# Patient Record
Sex: Female | Born: 1935
Health system: Southern US, Community
[De-identification: ages and names within clinical notes are randomized; demographics above are authoritative.]

## PROBLEM LIST (undated history)

## (undated) DIAGNOSIS — F0280 Dementia in other diseases classified elsewhere without behavioral disturbance: Secondary | ICD-10-CM

## (undated) DIAGNOSIS — E7800 Pure hypercholesterolemia, unspecified: Secondary | ICD-10-CM

## (undated) DIAGNOSIS — I10 Essential (primary) hypertension: Secondary | ICD-10-CM

## (undated) DIAGNOSIS — M1990 Unspecified osteoarthritis, unspecified site: Secondary | ICD-10-CM

## (undated) DIAGNOSIS — K5790 Diverticulosis of intestine, part unspecified, without perforation or abscess without bleeding: Secondary | ICD-10-CM

## (undated) HISTORY — DX: Essential (primary) hypertension: I10

## (undated) HISTORY — PX: ROTATOR CUFF REPAIR: SHX139

## (undated) HISTORY — DX: Diverticulosis of intestine, part unspecified, without perforation or abscess without bleeding: K57.90

## (undated) HISTORY — PX: OTHER SURGICAL HISTORY: SHX169

## (undated) HISTORY — DX: Pure hypercholesterolemia, unspecified: E78.00

## (undated) HISTORY — DX: Unspecified osteoarthritis, unspecified site: M19.90

## (undated) HISTORY — PX: BREAST BIOPSY: SHX20

---

## 1998-01-10 ENCOUNTER — Ambulatory Visit (HOSPITAL_COMMUNITY): Admission: RE | Admit: 1998-01-10 | Discharge: 1998-01-10 | Payer: Self-pay | Admitting: Obstetrics and Gynecology

## 1998-12-05 ENCOUNTER — Other Ambulatory Visit: Admission: RE | Admit: 1998-12-05 | Discharge: 1998-12-05 | Payer: Self-pay | Admitting: Obstetrics and Gynecology

## 1998-12-22 ENCOUNTER — Ambulatory Visit (HOSPITAL_COMMUNITY): Admission: RE | Admit: 1998-12-22 | Discharge: 1998-12-22 | Payer: Self-pay | Admitting: Obstetrics and Gynecology

## 1998-12-22 ENCOUNTER — Encounter: Payer: Self-pay | Admitting: Obstetrics and Gynecology

## 1999-10-19 ENCOUNTER — Ambulatory Visit (HOSPITAL_COMMUNITY): Admission: RE | Admit: 1999-10-19 | Discharge: 1999-10-19 | Payer: Self-pay | Admitting: Gastroenterology

## 1999-10-19 ENCOUNTER — Encounter (INDEPENDENT_AMBULATORY_CARE_PROVIDER_SITE_OTHER): Payer: Self-pay | Admitting: Specialist

## 1999-12-26 ENCOUNTER — Other Ambulatory Visit: Admission: RE | Admit: 1999-12-26 | Discharge: 1999-12-26 | Payer: Self-pay | Admitting: Obstetrics and Gynecology

## 1999-12-27 ENCOUNTER — Ambulatory Visit (HOSPITAL_COMMUNITY): Admission: RE | Admit: 1999-12-27 | Discharge: 1999-12-27 | Payer: Self-pay | Admitting: Obstetrics and Gynecology

## 1999-12-27 ENCOUNTER — Encounter: Payer: Self-pay | Admitting: Obstetrics and Gynecology

## 2000-12-26 ENCOUNTER — Other Ambulatory Visit: Admission: RE | Admit: 2000-12-26 | Discharge: 2000-12-26 | Payer: Self-pay | Admitting: Obstetrics and Gynecology

## 2000-12-26 ENCOUNTER — Encounter: Admission: RE | Admit: 2000-12-26 | Discharge: 2000-12-26 | Payer: Self-pay | Admitting: Obstetrics and Gynecology

## 2000-12-26 ENCOUNTER — Encounter: Payer: Self-pay | Admitting: Obstetrics and Gynecology

## 2001-01-03 ENCOUNTER — Encounter: Payer: Self-pay | Admitting: Obstetrics and Gynecology

## 2001-01-03 ENCOUNTER — Ambulatory Visit (HOSPITAL_COMMUNITY): Admission: RE | Admit: 2001-01-03 | Discharge: 2001-01-03 | Payer: Self-pay | Admitting: Obstetrics and Gynecology

## 2002-01-05 ENCOUNTER — Encounter: Payer: Self-pay | Admitting: Obstetrics and Gynecology

## 2002-01-05 ENCOUNTER — Ambulatory Visit (HOSPITAL_COMMUNITY): Admission: RE | Admit: 2002-01-05 | Discharge: 2002-01-05 | Payer: Self-pay | Admitting: Obstetrics and Gynecology

## 2002-08-25 ENCOUNTER — Encounter: Payer: Self-pay | Admitting: Obstetrics and Gynecology

## 2002-08-25 ENCOUNTER — Encounter: Admission: RE | Admit: 2002-08-25 | Discharge: 2002-08-25 | Payer: Self-pay | Admitting: Obstetrics and Gynecology

## 2002-09-25 ENCOUNTER — Encounter: Payer: Self-pay | Admitting: General Surgery

## 2002-09-25 ENCOUNTER — Encounter: Admission: RE | Admit: 2002-09-25 | Discharge: 2002-09-25 | Payer: Self-pay | Admitting: General Surgery

## 2003-01-06 ENCOUNTER — Ambulatory Visit (HOSPITAL_COMMUNITY): Admission: RE | Admit: 2003-01-06 | Discharge: 2003-01-06 | Payer: Self-pay | Admitting: Obstetrics and Gynecology

## 2003-01-06 ENCOUNTER — Encounter: Payer: Self-pay | Admitting: Obstetrics and Gynecology

## 2003-01-11 ENCOUNTER — Other Ambulatory Visit: Admission: RE | Admit: 2003-01-11 | Discharge: 2003-01-11 | Payer: Self-pay | Admitting: Obstetrics and Gynecology

## 2003-01-22 ENCOUNTER — Encounter: Payer: Self-pay | Admitting: Obstetrics and Gynecology

## 2003-01-22 ENCOUNTER — Encounter: Admission: RE | Admit: 2003-01-22 | Discharge: 2003-01-22 | Payer: Self-pay | Admitting: Obstetrics and Gynecology

## 2003-07-22 ENCOUNTER — Ambulatory Visit (HOSPITAL_COMMUNITY): Admission: RE | Admit: 2003-07-22 | Discharge: 2003-07-22 | Payer: Self-pay | Admitting: Gastroenterology

## 2003-08-26 ENCOUNTER — Inpatient Hospital Stay (HOSPITAL_COMMUNITY): Admission: EM | Admit: 2003-08-26 | Discharge: 2003-08-27 | Payer: Self-pay | Admitting: Emergency Medicine

## 2003-08-26 ENCOUNTER — Encounter: Payer: Self-pay | Admitting: Emergency Medicine

## 2003-08-27 ENCOUNTER — Encounter: Payer: Self-pay | Admitting: Internal Medicine

## 2004-03-14 ENCOUNTER — Ambulatory Visit (HOSPITAL_COMMUNITY): Admission: RE | Admit: 2004-03-14 | Discharge: 2004-03-14 | Payer: Self-pay | Admitting: Obstetrics and Gynecology

## 2005-01-01 ENCOUNTER — Encounter: Admission: RE | Admit: 2005-01-01 | Discharge: 2005-01-01 | Payer: Self-pay | Admitting: Obstetrics and Gynecology

## 2005-01-29 ENCOUNTER — Other Ambulatory Visit: Admission: RE | Admit: 2005-01-29 | Discharge: 2005-01-29 | Payer: Self-pay | Admitting: Addiction Medicine

## 2005-03-16 ENCOUNTER — Encounter: Admission: RE | Admit: 2005-03-16 | Discharge: 2005-03-16 | Payer: Self-pay | Admitting: Obstetrics and Gynecology

## 2005-12-21 ENCOUNTER — Ambulatory Visit (HOSPITAL_BASED_OUTPATIENT_CLINIC_OR_DEPARTMENT_OTHER): Admission: RE | Admit: 2005-12-21 | Discharge: 2005-12-21 | Payer: Self-pay | Admitting: Orthopedic Surgery

## 2006-04-02 ENCOUNTER — Encounter: Admission: RE | Admit: 2006-04-02 | Discharge: 2006-04-02 | Payer: Self-pay | Admitting: Obstetrics and Gynecology

## 2006-11-29 ENCOUNTER — Ambulatory Visit: Payer: Self-pay | Admitting: Gastroenterology

## 2006-11-29 LAB — CONVERTED CEMR LAB
Creatinine, Ser: 0.9 mg/dL (ref 0.4–1.2)
Tissue Transglutaminase Ab, IgA: <3 units (ref <5)

## 2006-12-02 ENCOUNTER — Ambulatory Visit: Payer: Self-pay | Admitting: Cardiology

## 2006-12-09 ENCOUNTER — Ambulatory Visit: Payer: Self-pay | Admitting: Gastroenterology

## 2006-12-09 LAB — CONVERTED CEMR LAB
OCCULT 1: NEGATIVE
OCCULT 3: NEGATIVE
OCCULT 5: NEGATIVE

## 2007-02-05 ENCOUNTER — Other Ambulatory Visit: Admission: RE | Admit: 2007-02-05 | Discharge: 2007-02-05 | Payer: Self-pay | Admitting: Obstetrics and Gynecology

## 2007-04-04 ENCOUNTER — Encounter: Admission: RE | Admit: 2007-04-04 | Discharge: 2007-04-04 | Payer: Self-pay | Admitting: Obstetrics and Gynecology

## 2007-06-09 ENCOUNTER — Emergency Department (HOSPITAL_COMMUNITY): Admission: EM | Admit: 2007-06-09 | Discharge: 2007-06-09 | Payer: Self-pay | Admitting: Emergency Medicine

## 2007-12-29 ENCOUNTER — Encounter: Admission: RE | Admit: 2007-12-29 | Discharge: 2007-12-29 | Payer: Self-pay | Admitting: Gynecology

## 2008-04-07 ENCOUNTER — Encounter: Admission: RE | Admit: 2008-04-07 | Discharge: 2008-04-07 | Payer: Self-pay | Admitting: Obstetrics and Gynecology

## 2008-06-17 ENCOUNTER — Telehealth: Payer: Self-pay | Admitting: Internal Medicine

## 2008-07-22 ENCOUNTER — Ambulatory Visit: Payer: Self-pay | Admitting: Internal Medicine

## 2008-07-29 ENCOUNTER — Ambulatory Visit: Payer: Self-pay | Admitting: Internal Medicine

## 2009-02-22 ENCOUNTER — Other Ambulatory Visit: Admission: RE | Admit: 2009-02-22 | Discharge: 2009-02-22 | Payer: Self-pay | Admitting: Obstetrics and Gynecology

## 2009-02-22 ENCOUNTER — Encounter: Payer: Self-pay | Admitting: Obstetrics and Gynecology

## 2009-02-22 ENCOUNTER — Ambulatory Visit: Payer: Self-pay | Admitting: Obstetrics and Gynecology

## 2009-04-08 ENCOUNTER — Encounter: Admission: RE | Admit: 2009-04-08 | Discharge: 2009-04-08 | Payer: Self-pay | Admitting: Obstetrics and Gynecology

## 2009-04-13 ENCOUNTER — Encounter: Admission: RE | Admit: 2009-04-13 | Discharge: 2009-04-13 | Payer: Self-pay | Admitting: Obstetrics and Gynecology

## 2009-05-04 ENCOUNTER — Encounter: Admission: RE | Admit: 2009-05-04 | Discharge: 2009-05-04 | Payer: Self-pay | Admitting: Family Medicine

## 2009-05-25 ENCOUNTER — Encounter: Admission: RE | Admit: 2009-05-25 | Discharge: 2009-05-25 | Payer: Self-pay | Admitting: Family Medicine

## 2009-05-31 ENCOUNTER — Encounter: Admission: RE | Admit: 2009-05-31 | Discharge: 2009-05-31 | Payer: Self-pay | Admitting: Family Medicine

## 2009-11-12 HISTORY — PX: REPLACEMENT TOTAL KNEE: SUR1224

## 2009-11-12 HISTORY — PX: CARPAL TUNNEL RELEASE: SHX101

## 2009-12-22 ENCOUNTER — Encounter: Admission: RE | Admit: 2009-12-22 | Discharge: 2009-12-22 | Payer: Self-pay | Admitting: Orthopedic Surgery

## 2009-12-27 ENCOUNTER — Ambulatory Visit (HOSPITAL_BASED_OUTPATIENT_CLINIC_OR_DEPARTMENT_OTHER): Admission: RE | Admit: 2009-12-27 | Discharge: 2009-12-27 | Payer: Self-pay | Admitting: Orthopedic Surgery

## 2010-03-01 ENCOUNTER — Ambulatory Visit: Payer: Self-pay | Admitting: Obstetrics and Gynecology

## 2010-04-11 ENCOUNTER — Encounter: Admission: RE | Admit: 2010-04-11 | Discharge: 2010-04-11 | Payer: Self-pay | Admitting: Obstetrics and Gynecology

## 2010-10-23 ENCOUNTER — Inpatient Hospital Stay (HOSPITAL_COMMUNITY)
Admission: RE | Admit: 2010-10-23 | Discharge: 2010-10-26 | Payer: Self-pay | Source: Home / Self Care | Attending: Orthopedic Surgery | Admitting: Orthopedic Surgery

## 2011-01-22 LAB — CBC
HCT: 25.8 % — ABNORMAL LOW (ref 36.0–46.0)
Hemoglobin: 8.4 g/dL — ABNORMAL LOW (ref 12.0–15.0)
MCH: 30.1 pg (ref 26.0–34.0)
MCHC: 32.6 g/dL (ref 30.0–36.0)
MCV: 92.5 fL (ref 78.0–100.0)
Platelets: 171 10*3/uL (ref 150–400)
RBC: 2.79 MIL/uL — ABNORMAL LOW (ref 3.87–5.11)
RDW: 13.7 % (ref 11.5–15.5)
WBC: 6.5 10*3/uL (ref 4.0–10.5)

## 2011-01-22 LAB — PROTIME-INR: Prothrombin Time: 23.8 seconds — ABNORMAL HIGH (ref 11.6–15.2)

## 2011-01-23 LAB — CBC
HCT: 29.8 % — ABNORMAL LOW (ref 36.0–46.0)
HCT: 37.7 % (ref 36.0–46.0)
Hemoglobin: 10.1 g/dL — ABNORMAL LOW (ref 12.0–15.0)
Hemoglobin: 13 g/dL (ref 12.0–15.0)
MCH: 31.6 pg (ref 26.0–34.0)
MCHC: 33.9 g/dL (ref 30.0–36.0)
MCHC: 34.5 g/dL (ref 30.0–36.0)
MCV: 91.7 fL (ref 78.0–100.0)
MCV: 93.2 fL (ref 78.0–100.0)
Platelets: 188 10*3/uL (ref 150–400)
RBC: 3.08 MIL/uL — ABNORMAL LOW (ref 3.87–5.11)
RBC: 4.11 MIL/uL (ref 3.87–5.11)
RDW: 13.7 % (ref 11.5–15.5)
RDW: 13.7 % (ref 11.5–15.5)
WBC: 7.7 10*3/uL (ref 4.0–10.5)
WBC: 8.2 10*3/uL (ref 4.0–10.5)

## 2011-01-23 LAB — BASIC METABOLIC PANEL
BUN: 5 mg/dL — ABNORMAL LOW (ref 6–23)
Calcium: 8.7 mg/dL (ref 8.4–10.5)
Chloride: 101 mEq/L (ref 96–112)
Creatinine, Ser: 0.73 mg/dL (ref 0.4–1.2)
GFR calc Af Amer: >60 mL/min (ref >60)
GFR calc non Af Amer: >60 mL/min (ref >60)
GFR calc non Af Amer: >60 mL/min (ref >60)
Potassium: 4.7 mEq/L (ref 3.5–5.1)
Sodium: 133 mEq/L — ABNORMAL LOW (ref 135–145)

## 2011-01-23 LAB — COMPREHENSIVE METABOLIC PANEL
ALT: 18 U/L (ref 0–35)
AST: 30 U/L (ref 0–37)
Albumin: 4.3 g/dL (ref 3.5–5.2)
Alkaline Phosphatase: 57 U/L (ref 39–117)
Chloride: 104 mEq/L (ref 96–112)
GFR calc Af Amer: >60 mL/min (ref >60)
Potassium: 4.7 mEq/L (ref 3.5–5.1)
Sodium: 141 mEq/L (ref 135–145)
Total Bilirubin: 0.6 mg/dL (ref 0.3–1.2)
Total Protein: 7.4 g/dL (ref 6.0–8.3)

## 2011-01-23 LAB — URINALYSIS, ROUTINE W REFLEX MICROSCOPIC
Protein, ur: NEGATIVE mg/dL
Specific Gravity, Urine: 1.008 (ref 1.005–1.030)
Urobilinogen, UA: 0.2 mg/dL (ref 0.0–1.0)

## 2011-01-23 LAB — URINE MICROSCOPIC-ADD ON

## 2011-01-23 LAB — PROTIME-INR
INR: 0.99 (ref 0.00–1.49)
INR: 1.3 (ref 0.00–1.49)
Prothrombin Time: 13.3 seconds (ref 11.6–15.2)
Prothrombin Time: 16.4 seconds — ABNORMAL HIGH (ref 11.6–15.2)

## 2011-01-23 LAB — TYPE AND SCREEN: Antibody Screen: NEGATIVE

## 2011-01-23 LAB — SURGICAL PCR SCREEN
MRSA, PCR: NEGATIVE
Staphylococcus aureus: POSITIVE — AB

## 2011-01-23 LAB — ABO/RH: ABO/RH(D): O POS

## 2011-01-31 LAB — POCT HEMOGLOBIN-HEMACUE: Hemoglobin: 13.8 g/dL (ref 12.0–15.0)

## 2011-02-19 ENCOUNTER — Other Ambulatory Visit: Payer: Self-pay | Admitting: Obstetrics and Gynecology

## 2011-02-19 DIAGNOSIS — Z1231 Encounter for screening mammogram for malignant neoplasm of breast: Secondary | ICD-10-CM

## 2011-03-05 ENCOUNTER — Other Ambulatory Visit: Payer: Self-pay | Admitting: Obstetrics and Gynecology

## 2011-03-05 ENCOUNTER — Encounter (INDEPENDENT_AMBULATORY_CARE_PROVIDER_SITE_OTHER): Payer: Medicare Other | Admitting: Obstetrics and Gynecology

## 2011-03-05 ENCOUNTER — Other Ambulatory Visit (HOSPITAL_COMMUNITY)
Admission: RE | Admit: 2011-03-05 | Discharge: 2011-03-05 | Disposition: A | Discharge status: Home / Self Care | Payer: Medicare Other | Source: Ambulatory Visit | Attending: Obstetrics and Gynecology | Admitting: Obstetrics and Gynecology

## 2011-03-05 DIAGNOSIS — Z124 Encounter for screening for malignant neoplasm of cervix: Secondary | ICD-10-CM | POA: Insufficient documentation

## 2011-03-05 DIAGNOSIS — R823 Hemoglobinuria: Secondary | ICD-10-CM

## 2011-03-05 DIAGNOSIS — N951 Menopausal and female climacteric states: Secondary | ICD-10-CM

## 2011-03-05 DIAGNOSIS — R350 Frequency of micturition: Secondary | ICD-10-CM

## 2011-03-05 DIAGNOSIS — N8111 Cystocele, midline: Secondary | ICD-10-CM

## 2011-03-30 NOTE — Assessment & Plan Note (Signed)
[facility]                         GASTROENTEROLOGY OFFICE NOTE   NAME:[name] [name] SINOR                      MRN:          [id number]  DATE:11/29/2006                            DOB:          28-Sep-1936    [name] [name] comes in, she is 75 years of age, she is referred by Dr. Rodrigo Ran for a change in stools for almost a month.  She says it is pencil-  shaped and she has been having mild lower abdominal pain at time.  She  stops wheat products and she loses approximately 4 pounds.  She wondered  about celiac disease.  She has had colon polyps in the past and her  mother had colon cancer, she is very stressed by having watched her  mother die from this disease process.   MEDICATIONS:  Actonel, Zocor, omeprazole, Dyazide, aspirin,  multivitamins, calcium, Naprosyn, fish oil, flax seed.   PAST MEDICAL HISTORY:  Really not very significant except for some mild  arthritis and breast surgery.   SOCIAL HISTORY:  Noncontributory.   REVIEW OF SYSTEMS:  Only reveals some arthritis.   PHYSICAL EXAMINATION:  She was 5 feet 4-1/2 inches, weighed 166, blood  pressure 118/72, pulse and 84 and regular.  Neck arteries and extremities are unremarkable.  ABDOMEN:  Tender to palpation in the left lower quadrant.   IMPRESSION:  1. Left lower quadrant pain with change in the caliber of stools in a      patient whose mother has had colon cancer - probably related to      tortuosity of this lady's colon which is somewhat fixed.  She has      had multiple colonoscopic examinations, almost all have shown a      very tortuous left colon and some mild diverticular disease.  2. She has also had some polyps of her colon in the past.  3. Arthritis.   RECOMMENDATIONS:  1. Get Coloscreens.  2. Get a blood study for celiac sprue.  3. Increase her fiber to see if she get change back into more normal      bowel activity.  4. Take a laxative or suppository if necessary.  5. Get  a repeat CT of her abdomen and pelvis.  I think this would      reassure her.  I really do not      think that repeating the colonoscopic examination at this time      would be in order.  She has had a number of them and hopefully the      CT scan will be reassuring enough to her.     Ulyess Mort, MD  Electronically Signed    SML/MedQ  DD: 11/29/2006  DT: 11/29/2006  Job #: [id number]

## 2011-03-30 NOTE — Op Note (Signed)
NAME:  Paula [name], Paula [name]               ACCOUNT NO.:  [id number]   MEDICAL RECORD NO.:  [id number]          PATIENT TYPE:  AMB   LOCATION:  DSC                          FACILITY:  [facility]   PHYSICIAN:  Harvie Junior, M.D.   DATE OF BIRTH:  August 06, 1936   DATE OF PROCEDURE:  12/21/2005  DATE OF DISCHARGE:                                 OPERATIVE REPORT   PREOPERATIVE DIAGNOSIS:  Long-standing rotator cuff tear with impingement,  acromioclavicular joint arthritis, and biceps tendon issues.   POSTOPERATIVE DIAGNOSIS:  Long-standing rotator cuff tear with impingement,  acromioclavicular joint arthritis, and biceps tendon issues.   PROCEDURE:  1.  Arthroscopic evaluation of his shoulder with debridement of rotator cuff      and biceps tendon tears.  2.  Subacromial decompression.  3.  Distal clavicle resection.   SURGEON:  Harvie Junior, M.D.   ASSISTANT:  Marshia Ly, P.A.-C.   ANESTHESIA:  General.   BRIEF HISTORY:  Paula [name] is a 75 year old female with a long history of  having significant right shoulder pain.  She was ultimately evaluated in the  office and noted to be dramatically weak.  MRI was obtained which showed  that she had a dramatic long-standing supraspinatus and infraspinatus tear  with atrophy in the musculature.  The subscap was also torn.  There were  loose bodies in the subscap recess.  The biceps tendon was also torn.  At  this point, we talked about treatment options and ultimately felt that  debridement would probably be the most appropriate course of action  understanding that taking down the acromion would probably take down the CA  ligament and in doing so, she would have some initial weakness which would  resolve when the CA ligament healed.  She understood this preoperatively to  try to relieve her pain.  She was taken to the operating room for  evaluation.   DESCRIPTION OF PROCEDURE:  The patient was taken to the operating room and  after adequate  anesthesia was obtained with general anesthesia, the patient  was placed on the operating table.  The right shoulder was then prepped and  draped in the usual sterile fashion.  Following this, routine arthroscopic  examination of the shoulder revealed there was an obvious tear of the  rotator cuff as there was just no fibers going laterally, at all.  This was  retracted to the glenoid margin.  The biceps tendon was anteriorly  translated and frayed and torn and, at this point, I felt that this was not  performing any useful function and because of the fray, the biceps tendon  was released at this point.  Following this, the rotator cuff was grasped on  the lateral side.  We used a spatula to go between the glenoid and the  rotator cuff to free the cuff up front to back, subscap, supraspinatus,  infraspinatus, and back to the teres minor.  Once we had freed this up  dramatically with the spatula, a grasper was used and really could not  mobilize the cuff much past the glenoid margin.  We could come up about 1 cm  past the glenoid margin just not enough to get to where we could do any sort  of realistic reasonable rotator cuff repair.  At that point, we elected to  debride the remaining portions of the rotator cuff back past the glenoid  margins so it would not be catching in the shoulder.  Once this was  accomplished, attention was turned to the acromion where the large anterior  spur was debrided.  This was done from both the lateral and posterior  compartment.  Once that was completed, 20 mm of distal clavicle was resected  from an anterior compartment.  Then, a final check was made down into the  glenohumeral recesses.  We went up into the subscap recess and no other  loose bodies were identified.  At this point, the shoulder was copiously  irrigated and suctioned dry.  The arthroscopic portals were closed with a  bandage.  A sterile compressive dressing was applied and the patient was   taken to the recovery room where she was noted to be in satisfactory  condition.  Estimated blood loss was none.      Harvie Junior, M.D.  Electronically Signed     JLG/MEDQ  D:  12/21/2005  T:  12/21/2005  Job:  [id number]

## 2011-03-30 NOTE — H&P (Signed)
Paula [name], Paula [name]                         ACCOUNT NO.:  [id number]   MEDICAL RECORD NO.:  [id number]                   PATIENT TYPE:  INP   LOCATION:  2032                                 FACILITY:  [facility]   PHYSICIAN:  Mark A. Perini, M.D.                DATE OF BIRTH:  29-Mar-1936   DATE OF ADMISSION:  08/26/2003  DATE OF DISCHARGE:                                HISTORY & PHYSICAL   CHIEF COMPLAINT:  Chest pain.   HISTORY OF PRESENT ILLNESS:  Paula [name] is a pleasant 75 year old female  with no previous history of coronary artery disease but with a history of  positive for hypertension and impaired fasting glucose.  She was in her  usual state of health until noon today.  She was attending a funeral.  She  stood up and acutely developed acute chest pressure.  This felt as though it  was pushing through to her back.  She had radiation of this pressure feeling  to her neck, shoulders, and both arms.  Both arms felt somewhat numb.  This  lasted three to four minutes and then it transitioned into a 9 over 10  severe substernal chest pain.  She did not have any definite diaphoresis,  shortness of breath, nausea, or vomiting with this.  The total episode  lasted seven to ten minutes.  She then presented to the emergency room.  Initial workup was fairly unremarkable.   About two or three hours into her emergency department stay, she had a  second episode that lasted for three or four minutes.  An EKG taken around  that time also showed no acute EKG changes.  However, the patient will  require admission for further evaluation.   PAST MEDICAL HISTORY:  1. Impaired fasting glucose 2004.  2. Postmenopausal since 1991.  3. Benign breast biopsy in 1991.  4. Vocal cord polyp removed in the 1960s.  5. Sinus cyst removed in the 1970s.  6. G2, P2 parity status with both normal spontaneous vaginal deliveries.  7. History of colon polyp removed in 1999.  8. Osteoarthritis of the  bilateral knees.  9. Hypertension diagnosed in 2004.  10.      Osteopenia.  11.      Occasional mild reflux disease but no symptoms in the last six     months at least.   ALLERGIES:  No known drug allergies.   MEDICATIONS:  1. Celebrex 200 mg q.d.  2. Multivitamin q.d.  3. Vitamin E 400 mU q.d.  4. Glucosamine 1 tablet q.d.  5. Calcium with D 1500 mg calcium component daily.  6. Dyazide 37.5/25 mg 1 tablet q.d.  7. Fosamax 70 mg every week.   SOCIAL HISTORY:  She has been married since 1962.  Her husband [name] is very  supportive.  She has two children-a son and a daughter.  She has seven  grandchildren.  She  is retired from the travel business and Airline pilot.  She has  a no smoking history ever.  She has rare wine use and no drug use.   FAMILY HISTORY:  Father died at age 43 of cerebritis after having a  perforated colon and pneumonia.  Mother died at age 45 of colon cancer which  was diagnosed at age 27.  She has three sisters-one who died at age 77 of  complications of diabetes and hypertension, one older sister with diabetes,  hypertension, and coronary disease, and one other sister with just  hypertension.  Two children are healthy.   REVIEW OF SYMPTOMS:  As per the history of present illness.  The patient  denies any recent fevers or chills.  She denies any blood from above or  below.  She denies any genitourinary symptoms.  She has had no recent  orthopnea or edema.  She denies any palpitations, syncope, or presyncope-  type symptoms.   PHYSICAL EXAMINATION:  VITAL SIGNS:  Temperature 97.8, pulse 75, blood  pressure 133/79.  Saturation 100% on two liters of oxygen.  She is  semisupine.  In no acute distress.  HEENT: Normocephalic and atraumatic.  No JVD.  No icterus.  Pupils are  equal, round, and reactive to light.  GENERAL:  She is alert and oriented x 4 and appropriate.  LUNGS:  Clear to auscultation bilaterally with no wheezes, rales, or  rhonchi.  HEART:  Regular rate  and rhythm with no murmurs, rubs, or gallops.  ABDOMEN:  Positive bowel sounds.  Soft, nondistended.  No masses.  There is  slight mid-right quadrant tenderness.  This is very mild with no rebound or  guarding.  There is no peripheral edema.  EXTREMITIES:  There are 2+ distal pulses throughout.  NEUROLOGICAL:  The patient is grossly neurologically intact.   LABORATORY DATA:  White count 6.2 with a normal differential.  Hemoglobin  13.4, platelet count 262,000.  INR 0.9.  Serial one hour cardiac enzymes are  all normal.  Hemoglobin A1C from May of this year was 5.8.  Liver function  test today are normal.  BMET is also normal today.  EKG shows normal sinus  rhythm with no significant changes x 2.  Fasting cholesterol from December  of 2003 showed a total cholesterol of 211, triglycerides of 126, HDL 54, LDL  132 with a ratio of 3.9.  The patient over the last three to six months has  very impressively lost weight on purpose on the order of 10 pounds and has  been very active in trying to control her impaired fasting sugar.   ASSESSMENT/PLAN:  A 75 year old female with cardiac risk factors of impaired  fasting glucose, hypertension, and some family history of coronary disease  with an acute onset of substernal chest pain today x 2 episodes.  There are  no significant electrocardiogram changes but we must consider an ischemic  etiology as a possibility.  We will admit her, place her on telemetry bed  with intravenous access and oxygen.  We will check serial enzymes.  We will  continue home Dyazide dosing and add coated aspirin daily.  We will use  nitroglycerin paste for the initial period.  We will give one dose of  treatment dose Lovenox.  We will ask for cardiology consultation.  The  differential of her chest pain syndrome also includes a reflux variant and  this is possible given the fact that she takes Celebrex regularly and has been on Fosamax as  well.  Also a musculoskeletal cause  could be possible.                                                Mark A. Waynard Edwards, M.D.    MAP/MEDQ  D:  08/26/2003  T:  08/26/2003  Job:  [id number]

## 2011-03-30 NOTE — Consult Note (Signed)
NAMESHAWNTRICE, SALLE                         ACCOUNT NO.:  [id number]   MEDICAL RECORD NO.:  [id number]                   PATIENT TYPE:  INP   LOCATION:  2032                                 FACILITY:  [facility]   PHYSICIAN:  Olga Millers, M.D.                DATE OF BIRTH:  [birth year]   DATE OF CONSULTATION:  08/26/2003  DATE OF DISCHARGE:                                   CONSULTATION   HISTORY OF PRESENT ILLNESS:  Ms. Peachey is a 75 year old female with past  medical history of hypertension and gastroesophageal reflux disease who we  were asked to evaluate for chest pain. She has no prior cardiac history. She  typically does not have exertional chest pain, dyspnea on exertion,  orthopnea, PND, pedal edema, or syncope. She walks up to 2 to 2.5 miles per  day without any of the above symptoms. She was at a funeral today and  developed substernal chest pain. The patient was initially described as a  pressure. She also had associated numbness in her upper extremities  bilaterally. There was no associated shortness of breath, nausea, vomiting,  or diaphoresis. The pain was not pleuritic, nor was it positional. It lasted  for approximately four minutes and then changed to a sharp pain. This  lasted for another four minutes, and then her symptoms resolved. She came to  the emergency room for evaluation and had a second episode of pressure for  three minutes and has been asymptomatic since. We were asked to further  evaluate. Of note, she has not had recent travel, surgery, or leg injury.  She has no known drug allergies.   MEDICATIONS:  Her medications at home include Celebrex, multivitamin,  vitamin E, Dyazide, and Fosamax. She also takes calcium.   FAMILY HISTORY:  Significant for a father who had coronary artery bypass  grafting in his 105s.   SOCIAL HISTORY:  She does not smoke, nor does she consume alcohol.   PAST MEDICAL HISTORY:  Significant for hypertension, but there is  no  diabetes mellitus or hyperlipidemia. She has remote history of  gastroesophageal reflux disease. She also has a history of arthritis. She  has had no prior surgeries by report.   REVIEW OF SYSTEMS:  She denies any headaches or fevers or chills. There is  no productive cough or hemoptysis. There is no dysphagia, odynophagia,  melena, or hematochezia. There is no dysuria or hematuria. There is no rash  or seizure activity. There is no orthopnea, PND, or pedal edema. There is no  claudication noted. There is arthritis. Remaining systems are negative.   PHYSICAL EXAMINATION:  VITAL SIGNS:  Her physical exam today shows a blood  pressure of 133/77 and a pulse of 75. She is afebrile. She is 100% on 2  liters. She is well developed and well nourished in no acute distress.  SKIN:  Warm and dry.  HEENT:  Unremarkable with normal eyelids. She does not appear to be  depressed. There is no peripheral clubbing.  NECK:  Supple with a normal upstroke bilaterally, and there are no bruits  noted. There was no jugular venous distention. No thyromegaly noted.  CHEST:  Clear to auscultation. Normal expansion.  CARDIOVASCULAR:  Reveals a regular, rate, and rhythm with normal S1 and S2.  There are no murmurs, rubs, or gallops noted.  ABDOMINAL EXAM:  Mild tenderness in the mid epigastric area, but there is no  rebound or guarding. There is no hepatosplenomegaly and no pulsatile masses  noted. There is no bruit noted. She has 2+ femoral pulses bilaterally and no  bruits.  EXTREMITIES:  Show no edema, and I can palpate no cords. She does have  varicosities. She has 2+ dorsalis pedis pulses bilaterally.  NEUROLOGICAL:  Grossly intact.   LABORATORY DATA:  Her laboratories show white blood cell count of 6.2 with a  hemoglobin of 13.4 and hematocrit of 39.5. Platelet count of 262. Her sodium  was 142 with a potassium of 3.9, BUN and creatinine of 12 and 0.8. Her liver  functions are normal. Her enzymes are  negative. Her TSH is normal. Her  electrocardiogram shows normal sinus rhythm with no acute ST changes.   DIAGNOSES:  1. Chest pain.  2. History of hypertension.  3. History of gastroesophageal reflux disease.   PLAN:  Ms. Joslin presents with chest pain of uncertain etiology. Her  symptoms are somewhat atypical, and her electrocardiogram is normal;  however, I agree we need to exclude ischemia. I agree with continuing to  cycle enzymes to exclude myocardial infarction. We will also plan to recheck  her electrocardiogram in the morning. If negative, then we will plan a  stress Cardiolite tomorrow in house for risk stratification. We will also  check a D-dimer although I doubt pulmonary embolus. Of note, her liver  functions are normal. We will make further recommendations once we have the  above information.                                               Olga Millers, M.D.    BC/MEDQ  D:  08/26/2003  T:  08/27/2003  Job:  [id number]

## 2011-03-30 NOTE — Discharge Summary (Signed)
NAMEGABBRIELLE, Paula [name]                         ACCOUNT NO.:  [id number]   MEDICAL RECORD NO.:  [id number]                   PATIENT TYPE:  INP   LOCATION:  2032                                 FACILITY:  [facility]   PHYSICIAN:  Mark A. Perini, M.D.                DATE OF BIRTH:  [birth year]   DATE OF ADMISSION:  08/26/2003  DATE OF DISCHARGE:  08/27/2003                           DISCHARGE SUMMARY - REFERRING   PROCEDURES:  Graded exercise test Cardiolite.   HOSPITAL COURSE:  Paula [name] is a 75 year old female with no known history  of coronary artery disease. She has cardiac risk factors including  hypertension and an impaired fasting glucose.  At noon on the day of  admission while she was attending a funeral, she stood up and developed  acute chest pressure radiating through to her back.  The pressure radiated  to her neck, shoulders, and both arms.  It lasted three to four minutes and  then transitioned to substernal chest pain at 9/10.  The entire episode  lasted 7 to 10 minutes.  She presented to the emergency room.  She had a  second episode while in the emergency room, and an EKG performed at that  time showed no EKG changes.  She was admitted for further evaluation and  treatment.   She remained symptoms free overnight, and she had a proton pump inhibitor  added to her medication regimen.  Her enzymes were negative for MI, and the  next day she had a stress Cardiolite performed.  She achieved stage III of  the Bruce protocol but was unable to continue into stage IV.  Her target  heart rate was 131, but her total heart rate achieved was 160.  It was felt  that this was an adequate test.  There were no EKG changes with the test, no  ectopy, and no chest pain.  Cardiolite images showed no scar, no ischemia,  and an EF of 72%.   Paula [name] was evaluated by Dr. Juanda Chance and Dr. Waynard Edwards.  They both felt that  she could be discharged home if her Cardiolite was negative and that  traveling in eight days was acceptable.  She is to follow up with Dr. Waynard Edwards  and continue proton pump inhibitor.  Dr. Waynard Edwards requested followup in three  to four weeks.  Paula [name] was considered stable for discharge on August 26, 2003.   CONDITION ON DISCHARGE:  Stable.   DISCHARGE DIAGNOSES:  1. Chest pain, negative myocardial infarction by enzymes and negative     ischemia by Cardiolite.  2. Preserved left ventricular function with ejection fraction 72% by     Cardiolite.  3. Impaired fasting glucose.  4. Postmenopausal.  5. Status post benign breast biopsy, sinus cyst removal, and vocal cord     polyp removal.  6. Osteoarthritis.  7. Hypertension.  8. Osteopenia.  9. Occasional mild reflux.  DISCHARGE INSTRUCTIONS:  1. Her activity level is to be as tolerated.  2. She is to stick to a low-fat diet.  3. She is to follow up with Dr. Waynard Edwards in three to four weeks.   DISCHARGE MEDICATIONS:  1. Protonix 40 mg daily with food.  2. Fosamax is to be held.  3. Celebrex 200 mg daily.  4. Multivitamins daily.  5. Vitamin E daily.  6. Glucosamine daily.  7. Diazide 37.5/25 mg daily.  8. Calcium with vitamin D daily.      Lavella Hammock, P.A. [facility]                  Mark A. Perini, M.D.    RG/MEDQ  D:  08/27/2003  T:  08/27/2003  Job:  [id number]   cc:   Olga Millers, M.D.

## 2011-04-13 ENCOUNTER — Ambulatory Visit: Payer: Self-pay

## 2011-05-07 ENCOUNTER — Ambulatory Visit
Admission: RE | Admit: 2011-05-07 | Discharge: 2011-05-07 | Disposition: A | Discharge status: Home / Self Care | Payer: Private Health Insurance - Indemnity | Source: Ambulatory Visit | Attending: Obstetrics and Gynecology | Admitting: Obstetrics and Gynecology

## 2011-05-07 ENCOUNTER — Other Ambulatory Visit: Payer: Self-pay | Admitting: Internal Medicine

## 2011-05-07 DIAGNOSIS — N6459 Other signs and symptoms in breast: Secondary | ICD-10-CM

## 2011-05-07 DIAGNOSIS — Z1231 Encounter for screening mammogram for malignant neoplasm of breast: Secondary | ICD-10-CM

## 2011-05-21 ENCOUNTER — Other Ambulatory Visit: Payer: Self-pay | Admitting: Internal Medicine

## 2011-05-21 ENCOUNTER — Ambulatory Visit
Admission: RE | Admit: 2011-05-21 | Discharge: 2011-05-21 | Disposition: A | Discharge status: Home / Self Care | Payer: Medicare Other | Source: Ambulatory Visit | Attending: Internal Medicine | Admitting: Internal Medicine

## 2011-05-21 ENCOUNTER — Ambulatory Visit
Admission: RE | Admit: 2011-05-21 | Discharge: 2011-05-21 | Disposition: A | Discharge status: Home / Self Care | Payer: Private Health Insurance - Indemnity | Source: Ambulatory Visit | Attending: Internal Medicine | Admitting: Internal Medicine

## 2011-05-21 DIAGNOSIS — N6459 Other signs and symptoms in breast: Secondary | ICD-10-CM

## 2011-05-29 ENCOUNTER — Other Ambulatory Visit: Payer: Self-pay | Admitting: Dermatology

## 2011-11-30 ENCOUNTER — Ambulatory Visit
Admission: RE | Admit: 2011-11-30 | Discharge: 2011-11-30 | Disposition: A | Discharge status: Home / Self Care | Payer: Medicare Other | Source: Ambulatory Visit | Attending: Internal Medicine | Admitting: Internal Medicine

## 2011-11-30 ENCOUNTER — Other Ambulatory Visit: Payer: Self-pay | Admitting: Internal Medicine

## 2011-11-30 DIAGNOSIS — M542 Cervicalgia: Secondary | ICD-10-CM

## 2012-01-15 ENCOUNTER — Ambulatory Visit (INDEPENDENT_AMBULATORY_CARE_PROVIDER_SITE_OTHER): Payer: Medicare Other | Admitting: Obstetrics and Gynecology

## 2012-01-15 DIAGNOSIS — N6489 Other specified disorders of breast: Secondary | ICD-10-CM

## 2012-01-15 DIAGNOSIS — N63 Unspecified lump in unspecified breast: Secondary | ICD-10-CM

## 2012-01-15 NOTE — Patient Instructions (Signed)
Call me when hematoma resolves to schedule mammogram.

## 2012-01-15 NOTE — Progress Notes (Signed)
Patient came in today with a one-day history of feeling a lump in her right breast. Approximately one week ago after moving boxes she developed a hematoma in her right breast. She does take a 81 mg ASA. She was taking it 3 times a week but within the last week she increased to daily. Her last mammogram was June, 2012.  Exam: Kennon Portela present. Both her breasts were carefully examined in the sitting and lying position. Her left breast is completely normal. Her right breast has a hematoma above the nipple of 3-4 centimeters. Within the hematoma is a very hard area of 1-2 mm which could be part of the process or something else.  Assessment: Right breast hematoma with associated mass.  Plan: Reduced aspirin to 3 times a week. Call me when hematoma resolves to schedule diagnostic mammogram regardless of whether the lump is still there or not.

## 2012-01-30 ENCOUNTER — Telehealth: Payer: Self-pay | Admitting: *Deleted

## 2012-01-30 DIAGNOSIS — N63 Unspecified lump in unspecified breast: Secondary | ICD-10-CM

## 2012-01-30 NOTE — Telephone Encounter (Signed)
Pt called stating her bruise has went away and per Dr. Reece Agar office note okay for pt to have diag. Mammogram for breast center. Order placed. Pt informed with the below note.

## 2012-02-08 ENCOUNTER — Other Ambulatory Visit: Payer: Self-pay | Admitting: Obstetrics and Gynecology

## 2012-02-08 ENCOUNTER — Ambulatory Visit
Admission: RE | Admit: 2012-02-08 | Discharge: 2012-02-08 | Disposition: A | Discharge status: Home / Self Care | Payer: Medicare Other | Source: Ambulatory Visit | Attending: Obstetrics and Gynecology | Admitting: Obstetrics and Gynecology

## 2012-02-08 DIAGNOSIS — N63 Unspecified lump in unspecified breast: Secondary | ICD-10-CM

## 2012-03-05 ENCOUNTER — Encounter: Payer: Self-pay | Admitting: Obstetrics and Gynecology

## 2012-03-05 ENCOUNTER — Ambulatory Visit (INDEPENDENT_AMBULATORY_CARE_PROVIDER_SITE_OTHER): Payer: Medicare Other | Admitting: Obstetrics and Gynecology

## 2012-03-05 VITALS — BP 124/74 | Ht 64.0 in | Wt 151.0 lb

## 2012-03-05 DIAGNOSIS — M8580 Other specified disorders of bone density and structure, unspecified site: Secondary | ICD-10-CM

## 2012-03-05 DIAGNOSIS — R351 Nocturia: Secondary | ICD-10-CM

## 2012-03-05 DIAGNOSIS — I10 Essential (primary) hypertension: Secondary | ICD-10-CM | POA: Insufficient documentation

## 2012-03-05 DIAGNOSIS — M899 Disorder of bone, unspecified: Secondary | ICD-10-CM

## 2012-03-05 DIAGNOSIS — E7800 Pure hypercholesterolemia, unspecified: Secondary | ICD-10-CM | POA: Insufficient documentation

## 2012-03-05 DIAGNOSIS — N390 Urinary tract infection, site not specified: Secondary | ICD-10-CM

## 2012-03-05 DIAGNOSIS — N952 Postmenopausal atrophic vaginitis: Secondary | ICD-10-CM

## 2012-03-05 DIAGNOSIS — M1990 Unspecified osteoarthritis, unspecified site: Secondary | ICD-10-CM | POA: Insufficient documentation

## 2012-03-05 DIAGNOSIS — M949 Disorder of cartilage, unspecified: Secondary | ICD-10-CM

## 2012-03-05 DIAGNOSIS — N63 Unspecified lump in unspecified breast: Secondary | ICD-10-CM

## 2012-03-05 NOTE — Progress Notes (Signed)
Patient came to see me today for further followup. I had seen her in March with a lump in her right breast. It was a hematoma that occurred after a traumatic incident. The mass has completely resolved according to the patient. She went for mammogram and ultrasound which revealed no mass. She is due for followup mammogram of both her breasts the summer. She has previously been on both Evista and Fosamax for osteopenia. She is currently on drug holiday. She takes calcium and vitamin D. She has had no fractures. Her last bone density was at her PCP and was stable. She does have vaginal dryness but is not sexually active due to her husband. She does have nocturia x2 but does not require intervention. She has no dysuria or incontinence. She is having no vaginal bleeding. She is having no pelvic pain.  ROS: 12 sister reviewed done. Pertinent positives above. Other positives are osteoarthritis and hyperlipidemia. In her problem list hypertension is listed and on her medications she is taking benazepril. She does not think she is hypertensive and is not sure why she is on the benazepril.  Physical examination: Kennon Portela present HEENT within normal limits. Neck: Thyroid not large. No masses. Supraclavicular nodes: not enlarged. Breasts: Examined in both sitting and lying  position. No skin changes and no masses. Abdomen: Soft no guarding rebound or masses or hernia. Pelvic: External: Within normal limits. BUS: Within normal limits. Vaginal:within normal limits. Poor estrogen effect. No evidence of cystocele rectocele or enterocele. Cervix: clean. Uterus: Normal size and shape. Adnexa: No masses. Rectovaginal exam: Confirmatory and negative. Extremities: Within normal limits.  Assessment: #1. Hematoma of right breast-resolved #2. Atrophic vaginitis #3. Nocturia #4. Osteopenia on drug holiday.  Plan: Reassured about her breasts. Mammogram of both breasts the summer. Bone density through PCP. Observation of  atrophic vaginitis and nocturia. Patient to discuss whether she should continue benazepril with PCP.

## 2012-03-06 ENCOUNTER — Other Ambulatory Visit: Payer: Self-pay | Admitting: Obstetrics and Gynecology

## 2012-03-06 DIAGNOSIS — Z1231 Encounter for screening mammogram for malignant neoplasm of breast: Secondary | ICD-10-CM

## 2012-03-06 LAB — URINALYSIS W MICROSCOPIC + REFLEX CULTURE
Bacteria, UA: NONE SEEN
Casts: NONE SEEN
Hgb urine dipstick: NEGATIVE
Ketones, ur: NEGATIVE mg/dL
Leukocytes, UA: NEGATIVE
Nitrite: NEGATIVE
Protein, ur: NEGATIVE mg/dL
Urobilinogen, UA: 0.2 mg/dL (ref 0.0–1.0)

## 2012-05-21 ENCOUNTER — Ambulatory Visit
Admission: RE | Admit: 2012-05-21 | Discharge: 2012-05-21 | Disposition: A | Discharge status: Home / Self Care | Payer: Medicare Other | Source: Ambulatory Visit | Attending: Obstetrics and Gynecology | Admitting: Obstetrics and Gynecology

## 2012-05-21 DIAGNOSIS — Z1231 Encounter for screening mammogram for malignant neoplasm of breast: Secondary | ICD-10-CM

## 2012-06-12 ENCOUNTER — Other Ambulatory Visit: Payer: Self-pay | Admitting: Dermatology

## 2012-07-02 ENCOUNTER — Other Ambulatory Visit: Payer: Self-pay | Admitting: Dermatology

## 2012-08-21 ENCOUNTER — Other Ambulatory Visit: Payer: Self-pay | Admitting: Internal Medicine

## 2012-08-21 DIAGNOSIS — R413 Other amnesia: Secondary | ICD-10-CM

## 2012-08-25 ENCOUNTER — Other Ambulatory Visit: Payer: Medicare Other

## 2012-08-26 ENCOUNTER — Ambulatory Visit
Admission: RE | Admit: 2012-08-26 | Discharge: 2012-08-26 | Disposition: A | Discharge status: Home / Self Care | Payer: Medicare Other | Source: Ambulatory Visit | Attending: Internal Medicine | Admitting: Internal Medicine

## 2012-08-26 DIAGNOSIS — R413 Other amnesia: Secondary | ICD-10-CM

## 2012-11-26 DIAGNOSIS — R413 Other amnesia: Secondary | ICD-10-CM | POA: Insufficient documentation

## 2013-03-17 ENCOUNTER — Encounter: Payer: Self-pay | Admitting: Gynecology

## 2013-03-24 ENCOUNTER — Ambulatory Visit (INDEPENDENT_AMBULATORY_CARE_PROVIDER_SITE_OTHER): Payer: Medicare Other | Admitting: Gynecology

## 2013-03-24 ENCOUNTER — Encounter: Payer: Self-pay | Admitting: Gynecology

## 2013-03-24 VITALS — BP 116/70 | Ht 64.25 in | Wt 156.0 lb

## 2013-03-24 DIAGNOSIS — N952 Postmenopausal atrophic vaginitis: Secondary | ICD-10-CM

## 2013-03-24 DIAGNOSIS — M8580 Other specified disorders of bone density and structure, unspecified site: Secondary | ICD-10-CM

## 2013-03-24 DIAGNOSIS — R351 Nocturia: Secondary | ICD-10-CM

## 2013-03-24 DIAGNOSIS — M899 Disorder of bone, unspecified: Secondary | ICD-10-CM

## 2013-03-24 NOTE — Patient Instructions (Addendum)
Remember to schedule your colonoscopy and bone density and mamogram

## 2013-03-24 NOTE — Progress Notes (Signed)
Paula [name] [birth year] [id number]   History:    77 y.o.  Who presents to the office today for followup.patient last year had a lump in the right breast and turned out to be a traumatic hematoma and had a negative ultrasound and mammogram. Review of her records indicated that she had previously been on both Evista and Fosamax for osteopenia. She is currently on drug holiday. She is currently taking her calcium and vitamin D. No fractures reported. Her last bone density study was done by her primary care physician which back in 2011 demonstrated that she had an 8.4% improvement on her AP spine and no change in the left femoral neck. Her lowest T score was in the AP spine with a T score of -2.1. She states that occasionally she might have some nocturia. She denies any prior history of abnormal Pap smears. She does have osteoarthritis and hypertension in her primary physician is Dr. Waynard Edwards who has been doing her lab work. She states that all her vaccinations are up-to-date. Her mother had history of colon cancer in patients last colonoscopy was in 2009.  Past medical history,surgical history, family history and social history were all reviewed and documented in the EPIC chart.  Gynecologic History No LMP recorded. Patient is postmenopausal. Contraception: post menopausal status Last Pap: 2012. Results were: normal Last mammogram: 2013. Results were: normal  Obstetric History OB History   Grav Para Term Preterm Abortions TAB SAB Ect Mult Living   2 2   0     2     # Outc Date GA Lbr Len/2nd Wgt Sex Del Anes PTL Lv   1 PAR            2 PAR                ROS: A ROS was performed and pertinent positives and negatives are included in the history.  GENERAL: No fevers or chills. HEENT: No change in vision, no earache, sore throat or sinus congestion. NECK: No pain or stiffness. CARDIOVASCULAR: No chest pain or pressure. No palpitations. PULMONARY: No shortness of breath, cough or wheeze.  GASTROINTESTINAL: No abdominal pain, nausea, vomiting or diarrhea, melena or bright red blood per rectum. GENITOURINARY: No urinary frequency, urgency, hesitancy or dysuria. MUSCULOSKELETAL: No joint or muscle pain, no back pain, no recent trauma. DERMATOLOGIC: No rash, no itching, no lesions. ENDOCRINE: No polyuria, polydipsia, no heat or cold intolerance. No recent change in weight. HEMATOLOGICAL: No anemia or easy bruising or bleeding. NEUROLOGIC: No headache, seizures, numbness, tingling or weakness. PSYCHIATRIC: No depression, no loss of interest in normal activity or change in sleep pattern.     Exam: chaperone present  BP 116/70  Ht 5' 4.25" (1.632 m)  Wt 156 lb (70.761 kg)  BMI 26.57 kg/m2  Body mass index is 26.57 kg/(m^2).  General appearance : Well developed well nourished female. No acute distress HEENT: Neck supple, trachea midline, no carotid bruits, no thyroidmegaly Lungs: Clear to auscultation, no rhonchi or wheezes, or rib retractions  Heart: Regular rate and rhythm, no murmurs or gallops Breast:Examined in sitting and supine position were symmetrical in appearance, no palpable masses or tenderness,  no skin retraction, no nipple inversion, no nipple discharge, no skin discoloration, no axillary or supraclavicular lymphadenopathy Abdomen: no palpable masses or tenderness, no rebound or guarding Extremities: no edema or skin discoloration or tenderness  Pelvic:  Bartholin, Urethra, Skene Glands: Within normal limits  Vagina: No gross lesions or discharge, vaginal atrophy  Cervix: No gross lesions or discharge  Uterus  axial, normal size, shape and consistency, non-tender and mobile  Adnexa  Without masses or tenderness  Anus and perineum  normal   Rectovaginal  normal sphincter tone without palpated masses or tenderness             Hemoccult patient will schedule colonoscopy this year     Assessment/Plan:  77 y.o. female with past history of traumatic  breast hematoma (right) who had a normal ultrasound and mammogram at that time. Patient will be scheduling her followup mammogram in July of this year. She's also due for her bone density study. She states that both of these tests will be done at her PCP office. I reminded her also that she is now 5 years since her last colonoscopy and she should be contacting Dr. Juanda Chance her gastroenterologist to schedule followup colonoscopy since her mother died of colon cancer. Patient will no longer need Pap smear in accordance to the new guidelines. She was reminded on the importance of calcium and vitamin D and regular exercise for osteoporosis prevention. I've asked her to tell her physician to send me a copy of her bone density study once is done later this year.    Ok Edwards MD, 11:36 AM 03/24/2013

## 2013-04-01 ENCOUNTER — Encounter: Payer: Self-pay | Admitting: Obstetrics and Gynecology

## 2013-04-21 ENCOUNTER — Encounter: Payer: Self-pay | Admitting: Internal Medicine

## 2013-04-24 ENCOUNTER — Other Ambulatory Visit: Payer: Self-pay

## 2013-04-24 DIAGNOSIS — Z1231 Encounter for screening mammogram for malignant neoplasm of breast: Secondary | ICD-10-CM

## 2013-05-25 ENCOUNTER — Ambulatory Visit
Admission: RE | Admit: 2013-05-25 | Discharge: 2013-05-25 | Disposition: A | Discharge status: Home / Self Care | Payer: Medicare Other | Source: Ambulatory Visit

## 2013-05-25 ENCOUNTER — Ambulatory Visit: Payer: Medicare Other

## 2013-05-25 DIAGNOSIS — Z1231 Encounter for screening mammogram for malignant neoplasm of breast: Secondary | ICD-10-CM

## 2013-07-06 ENCOUNTER — Encounter: Payer: Self-pay | Admitting: Internal Medicine

## 2013-07-06 ENCOUNTER — Ambulatory Visit (AMBULATORY_SURGERY_CENTER): Payer: Medicare Other | Admitting: *Deleted

## 2013-07-06 VITALS — Ht 64.25 in | Wt 157.0 lb

## 2013-07-06 DIAGNOSIS — Z800 Family history of malignant neoplasm of digestive organs: Secondary | ICD-10-CM

## 2013-07-06 MED ORDER — MOVIPREP 100 G PO SOLR: ORAL | Status: DC

## 2013-07-06 NOTE — Progress Notes (Signed)
Patient denies any allergies to eggs or soy. Patient denies any problems with anesthesia.  

## 2013-07-21 ENCOUNTER — Encounter: Payer: Self-pay | Admitting: Internal Medicine

## 2013-07-21 ENCOUNTER — Ambulatory Visit (AMBULATORY_SURGERY_CENTER): Payer: Medicare Other | Admitting: Internal Medicine

## 2013-07-21 VITALS — BP 124/72 | HR 56 | Temp 97.2°F | Resp 29 | Ht 64.25 in | Wt 157.0 lb

## 2013-07-21 DIAGNOSIS — Z800 Family history of malignant neoplasm of digestive organs: Secondary | ICD-10-CM

## 2013-07-21 DIAGNOSIS — Z1211 Encounter for screening for malignant neoplasm of colon: Secondary | ICD-10-CM

## 2013-07-21 MED ORDER — SODIUM CHLORIDE 0.9 % IV SOLN: 500.0000 mL | INTRAVENOUS | Status: DC

## 2013-07-21 NOTE — Op Note (Signed)
[facility] [address] [place], [address]   COLONOSCOPY PROCEDURE REPORT  PATIENT: Paula, [name]  MR#: [id number] BIRTHDATE: 1936-09-02 , 77  yrs. old GENDER: Female ENDOSCOPIST: Hart Carwin, MD REFERRED XB:JYNW Perini, M.D. PROCEDURE DATE:  07/21/2013 PROCEDURE:   Colonoscopy, screening First Screening Colonoscopy - Avg.  risk and is 50 yrs.  old or older - No.  Prior Negative Screening - Now for repeat screening. Above average risk  History of Adenoma - Now for follow-up colonoscopy & has been > or = to 3 yrs.  N/A  Polyps Removed Today? Yes. ASA CLASS:   Class II INDICATIONS:Patient's immediate family history of colon cancer. (parent), prior colonoscopy in 1989,1995,2000,2004,2009 MEDICATIONS: MAC sedation, administered by CRNA and propofol (Diprivan) 200mg  IV  DESCRIPTION OF PROCEDURE:   After the risks benefits and alternatives of the procedure were thoroughly explained, informed consent was obtained.  A digital rectal exam revealed no abnormalities of the rectum.   The LB PFC-H190 [id number]  endoscope was introduced through the anus and advanced to the cecum, which was identified by both the appendix and ileocecal valve. No adverse events experienced.   The quality of the prep was good, using MoviPrep  The instrument was then slowly withdrawn as the colon was fully examined.      COLON FINDINGS: There was severe diverticulosis noted in the sigmoid colon with associated tortuosity, angulation, muscular hypertrophy and colonic narrowing.  Retroflexed views revealed no abnormalities. The time to cecum=7 minutes 30 seconds.  Withdrawal time=6 minutes 0 seconds.  The scope was withdrawn and the procedure completed. COMPLICATIONS: There were no complications.  ENDOSCOPIC IMPRESSION: There was severe diverticulosis noted in the sigmoid colon , narrow lumen, hypertrophied folds, deep diverticuli  RECOMMENDATIONS: High fiber diet Metamucol or  any soluable fiber 1 tsp daily Recall colonoscopy  if medical condition  satisfactory   eSigned:  Hart Carwin, MD 07/21/2013 10:53 AM   cc:   PATIENT NAME:  Paula, [name] MR#: [id number]

## 2013-07-21 NOTE — Progress Notes (Signed)
Patient did not have preoperative order for IV antibiotic SSI prophylaxis. (G8918)  Patient did not experience any of the following events: a burn prior to discharge; a fall within the facility; wrong site/side/patient/procedure/implant event; or a hospital transfer or hospital admission upon discharge from the facility. (G8907)  

## 2013-07-21 NOTE — Progress Notes (Signed)
Patient is a poor historian regarding her medications.   There are many that the patient doesn't know why she is taking them.    She is on Aricept for her memory.   She hasn't taken the majority of her medications for 5 days.   She states that she " didn't know that she could take them."

## 2013-07-21 NOTE — Progress Notes (Signed)
Lidocaine-40mg IV prior to Propofol InductionPropofol given over incremental dosages 

## 2013-07-21 NOTE — Patient Instructions (Addendum)

## 2013-07-22 ENCOUNTER — Telehealth: Payer: Self-pay | Admitting: *Deleted

## 2013-07-22 NOTE — Telephone Encounter (Signed)
  Follow up Call-  Call back number 07/21/2013  Post procedure Call Back phone  # [phone]  Permission to leave phone message Yes    No answer and no machine

## 2014-03-25 ENCOUNTER — Encounter: Payer: Self-pay | Admitting: Gynecology

## 2014-03-25 ENCOUNTER — Ambulatory Visit (INDEPENDENT_AMBULATORY_CARE_PROVIDER_SITE_OTHER): Payer: Medicare Other | Admitting: Gynecology

## 2014-03-25 VITALS — BP 124/76 | Ht 64.25 in | Wt 152.0 lb

## 2014-03-25 DIAGNOSIS — N952 Postmenopausal atrophic vaginitis: Secondary | ICD-10-CM

## 2014-03-25 DIAGNOSIS — M8580 Other specified disorders of bone density and structure, unspecified site: Secondary | ICD-10-CM

## 2014-03-25 DIAGNOSIS — M949 Disorder of cartilage, unspecified: Secondary | ICD-10-CM

## 2014-03-25 DIAGNOSIS — M899 Disorder of bone, unspecified: Secondary | ICD-10-CM

## 2014-03-25 NOTE — Progress Notes (Signed)
Paula [name] 03/24/1936 [id number]   History:    78 y.o.  for  GYN followup.Review of her records indicated that she had previously been on both Evista and Fosamax for osteopenia. She is currently on drug holiday.  She is currently taking her calcium and vitamin D. No fractures reported. Her last bone density study was done by her primary care physician which back in 2011 demonstrated that she had an 8.4% improvement on her AP spine and no change in the left femoral neck. Her lowest T score was in the AP spine with a T score of -2.1. Her PCP Dr. Treasa SchoolPirrini has been doing her bone density study and she believes she had one done last year which we do not have copies. Patient denies any prior history of abnormal Pap smears. She does have history of osteoarthritis and hypertension and is being followed by her PCP who has been doing also her blood work. She states that all her vaccinations are up-to-date. Her mother had colon cancer. Patient's last colonoscopy was in 2014 which she reports that it was normal.   Past medical history,surgical history, family history and social history were all reviewed and documented in the EPIC chart.  Gynecologic History No LMP recorded. Patient is postmenopausal. Contraception: post menopausal status Last Pap: 2012. Results were: normal Last mammogram: 2014. Results were: normal  Obstetric History OB History  Gravida Para Term Preterm AB SAB TAB Ectopic Multiple Living  2 2   0     2    # Outcome Date GA Lbr Len/2nd Weight Sex Delivery Anes PTL Lv  2 PAR           1 PAR                ROS: A ROS was performed and pertinent positives and negatives are included in the history.  GENERAL: No fevers or chills. HEENT: No change in vision, no earache, sore throat or sinus congestion. NECK: No pain or stiffness. CARDIOVASCULAR: No chest pain or pressure. No palpitations. PULMONARY: No shortness of breath, cough or wheeze. GASTROINTESTINAL: No abdominal pain,  nausea, vomiting or diarrhea, melena or bright red blood per rectum. GENITOURINARY: No urinary frequency, urgency, hesitancy or dysuria. MUSCULOSKELETAL: No joint or muscle pain, no back pain, no recent trauma. DERMATOLOGIC: No rash, no itching, no lesions. ENDOCRINE: No polyuria, polydipsia, no heat or cold intolerance. No recent change in weight. HEMATOLOGICAL: No anemia or easy bruising or bleeding. NEUROLOGIC: No headache, seizures, numbness, tingling or weakness. PSYCHIATRIC: No depression, no loss of interest in normal activity or change in sleep pattern.     Exam: chaperone present  BP 124/76  Ht 5' 4.25" (1.632 m)  Wt 152 lb (68.947 kg)  BMI 25.89 kg/m2  Body mass index is 25.89 kg/(m^2).  General appearance : Well developed well nourished female. No acute distress HEENT: Neck supple, trachea midline, no carotid bruits, no thyroidmegaly Lungs: Clear to auscultation, no rhonchi or wheezes, or rib retractions  Heart: Regular rate and rhythm, no murmurs or gallops Breast:Examined in sitting and supine position were symmetrical in appearance, no palpable masses or tenderness,  no skin retraction, no nipple inversion, no nipple discharge, no skin discoloration, no axillary or supraclavicular lymphadenopathy Abdomen: no palpable masses or tenderness, no rebound or guarding Extremities: no edema or skin discoloration or tenderness  Pelvic:  Bartholin, Urethra, Skene Glands: Within normal limits             Vagina: No gross  lesions or discharge atrophic changes, first degree cystocele  Cervix: No gross lesions or discharge  Uterus  axial, normal size, shape and consistency, non-tender and mobile  Adnexa  Without masses or tenderness  Anus and perineum  normal   Rectovaginal  normal sphincter tone without palpated masses or tenderness             Hemoccult PCP provides     Assessment/Plan:  78 y.o. female with a symptomatic vaginal atrophy not sexually active. Stable osteopenia as far  as bone density study done in 2011 although she stated that her PCP has done one  in 2014 and will continue to do the study at his office and we'll request a copy so that we can scan in the electronic record system. Pap smear was not done today in accordance to the new guidelines. We discussed the importance of calcium and vitamin D in regular exercise for osteoporosis prevention. She was reminded to schedule the mammogram later this year.   Note: This dictation was prepared with  Dragon/digital dictation along withSmart phrase technology. Any transcriptional errors that result from this process are unintentional.   Ok Paula [name], 2:57 PM 03/25/2014

## 2014-05-11 ENCOUNTER — Other Ambulatory Visit: Payer: Self-pay

## 2014-05-11 DIAGNOSIS — Z1231 Encounter for screening mammogram for malignant neoplasm of breast: Secondary | ICD-10-CM

## 2014-05-26 ENCOUNTER — Ambulatory Visit
Admission: RE | Admit: 2014-05-26 | Discharge: 2014-05-26 | Disposition: A | Discharge status: Home / Self Care | Payer: Medicare Other | Source: Ambulatory Visit

## 2014-05-26 DIAGNOSIS — Z1231 Encounter for screening mammogram for malignant neoplasm of breast: Secondary | ICD-10-CM

## 2014-09-13 ENCOUNTER — Encounter: Payer: Self-pay | Admitting: Gynecology

## 2015-03-28 ENCOUNTER — Encounter: Payer: Self-pay | Admitting: Gynecology

## 2015-04-01 ENCOUNTER — Encounter: Payer: Self-pay | Admitting: Gynecology

## 2015-04-01 ENCOUNTER — Ambulatory Visit (INDEPENDENT_AMBULATORY_CARE_PROVIDER_SITE_OTHER): Payer: Medicare Other | Admitting: Gynecology

## 2015-04-01 VITALS — BP 118/72 | Ht 64.0 in | Wt 161.0 lb

## 2015-04-01 DIAGNOSIS — IMO0002 Reserved for concepts with insufficient information to code with codable children: Secondary | ICD-10-CM

## 2015-04-01 DIAGNOSIS — N8110 Cystocele, unspecified: Secondary | ICD-10-CM | POA: Diagnosis not present

## 2015-04-01 DIAGNOSIS — M8580 Other specified disorders of bone density and structure, unspecified site: Secondary | ICD-10-CM | POA: Insufficient documentation

## 2015-04-01 DIAGNOSIS — Z01419 Encounter for gynecological examination (general) (routine) without abnormal findings: Secondary | ICD-10-CM

## 2015-04-01 DIAGNOSIS — IMO0001 Reserved for inherently not codable concepts without codable children: Secondary | ICD-10-CM

## 2015-04-01 NOTE — Progress Notes (Signed)
Paula [name], Paula [name] [id number]   History:    79 y.o.  for annual gyn exam with no complaints today. Review of patient's records indicated she has previously been on both Evista and Fosamax for osteopenia in the past and she's currently on a drug holiday. She is currently taking her calcium and vitamin D. No fractures reported.Her last bone density study was done by her primary care physician which back in 2011 demonstrated that she had an 8.4% improvement on her AP spine and no change in the left femoral neck. Her lowest T score was in the AP spine with a T score of -2.1. Her PCP Dr. Treasa SchoolPirrini has been doing her bone density study and she believes she had one done last year which we do not have copies. Patient denies any prior history of abnormal Pap smears. She does have history of osteoarthritis and hypertension and is being followed by her PCP who has been doing also her blood work. She states that all her vaccinations are up-to-date. Her mother had colon cancer. Patient's last colonoscopy was in 2014 which she reports that it was normal.  Past medical history,surgical history, family history and social history were all reviewed and documented in the EPIC chart.  Gynecologic History No LMP recorded. Patient is postmenopausal. Contraception: post menopausal status Last Pap: 2012. Results were: normal Last mammogram: 2015. Results were: normal  Obstetric History OB History  Gravida Para Term Preterm AB SAB TAB Ectopic Multiple Living  2 2   0     2    # Outcome Date GA Lbr Len/2nd Weight Sex Delivery Anes PTL Lv  2 Para           1 Para                ROS: A ROS was performed and pertinent positives and negatives are included in the history.  GENERAL: No fevers or chills. HEENT: No change in vision, no earache, sore throat or sinus congestion. NECK: No pain or stiffness. CARDIOVASCULAR: No chest pain or pressure. No palpitations. PULMONARY: No shortness of breath, cough or wheeze.  GASTROINTESTINAL: No abdominal pain, nausea, vomiting or diarrhea, melena or bright red blood per rectum. GENITOURINARY: No urinary frequency, urgency, hesitancy or dysuria. MUSCULOSKELETAL: No joint or muscle pain, no back pain, no recent trauma. DERMATOLOGIC: No rash, no itching, no lesions. ENDOCRINE: No polyuria, polydipsia, no heat or cold intolerance. No recent change in weight. HEMATOLOGICAL: No anemia or easy bruising or bleeding. NEUROLOGIC: No headache, seizures, numbness, tingling or weakness. PSYCHIATRIC: No depression, no loss of interest in normal activity or change in sleep pattern.     Exam: chaperone present  BP 118/72 mmHg  Ht 5\' 4"  (1.626 m)  Wt 161 lb (73.029 kg)  BMI 27.62 kg/m2  Body mass index is 27.62 kg/(m^2).  General appearance : Well developed well nourished female. No acute distress HEENT: Eyes: no retinal hemorrhage or exudates,  Neck supple, trachea midline, no carotid bruits, no thyroidmegaly Lungs: Clear to auscultation, no rhonchi or wheezes, or rib retractions  Heart: Regular rate and rhythm, no murmurs or gallops Breast:Examined in sitting and supine position were symmetrical in appearance, no palpable masses or tenderness,  no skin retraction, no nipple inversion, no nipple discharge, no skin discoloration, no axillary or supraclavicular lymphadenopathy Abdomen: no palpable masses or tenderness, no rebound or guarding Extremities: no edema or skin discoloration or tenderness  Pelvic:  Bartholin, Urethra, Skene Glands: Within normal limits  Vagina: No gross lesions or discharge, first to second-degree cystocele  Cervix: No gross lesions or discharge  Uterus  axial, normal size, shape and consistency, non-tender and mobile  Adnexa  Without masses or tenderness  Anus and perineum  normal   Rectovaginal  normal sphincter tone without palpated masses or tenderness             Hemoccult PCP provides     Assessment/Plan:  79 y.o. female for  annual exam with some vaginal atrophy but asymptomatic. Reports no vaginal bleeding. No hormonal replacement therapy. I have asked her to check with her PCP to send as a copy most recent bone density study since the last study we have is from 2011. She was encouraged to continue her calcium vitamin D and regular exercise for osteoporosis prevention. Pap smear no longer indicated according to the new guidelines. Her PCP we'll be doing her blood work. She was reminded to schedule her mammogram for later this year.   Ok EdwardsFERNANDEZ,JUAN H MD, 2:32 PM 04/01/2015

## 2015-04-01 NOTE — Patient Instructions (Signed)
Please ask Dr. Darcus AustinPerrini to send me a copy of the most recent bone density. The last one I have is dated 2011. Bone Densitometry Bone densitometry is a special X-ray that measures your bone density and can be used to help predict your risk of bone fractures. This test is used to determine bone mineral content and density to diagnose osteoporosis. Osteoporosis is the loss of bone that may cause the bone to become weak. Osteoporosis commonly occurs in women entering menopause. However, it may be found in men and in people with other diseases. PREPARATION FOR TEST No preparation necessary. WHO SHOULD BE TESTED?  All women older than 7465.  Postmenopausal women (50 to 6365) with risk factors for osteoporosis.  People with a previous fracture caused by normal activities.  People with a small body frame (less than 127 poundsor a body mass index [BMI] of less than 21).  People who have a parent with a hip fracture or history of osteoporosis.  People who smoke.  People who have rheumatoid arthritis.  Anyone who engages in excessive alcohol use (more than 3 drinks most days).  Women who experience early menopause. WHEN SHOULD YOU BE RETESTED? Current guidelines suggest that you should wait at least 2 years before doing a bone density test again if your first test was normal.Recent studies indicated that women with normal bone density may be able to wait a few years before needing to repeat a bone density test. You should discuss this with your caregiver.  NORMAL FINDINGS   Normal: less than standard deviation below normal (greater than -1).  Osteopenia: 1 to 2.5 standard deviations below normal (-1 to -2.5).  Osteoporosis: greater than 2.5 standard deviations below normal (less than -2.5). Test results are reported as a "T score" and a "Z score."The T score is a number that compares your bone density with the bone density of healthy, young women.The Z score is a number that compares your bone  density with the scores of women who are the same age, gender, and race.  Ranges for normal findings may vary among different laboratories and hospitals. You should always check with your doctor after having lab work or other tests done to discuss the meaning of your test results and whether your values are considered within normal limits. MEANING OF TEST  Your caregiver will go over the test results with you and discuss the importance and meaning of your results, as well as treatment options and the need for additional tests if necessary. OBTAINING THE TEST RESULTS It is your responsibility to obtain your test results. Ask the lab or department performing the test when and how you will get your results. Document Released: 11/20/2004 Document Revised: 01/21/2012 Document Reviewed: 12/13/2010 [facility] Patient Information 2015 [employer], [place]. This information is not intended to replace advice given to you by your health care provider. Make sure you discuss any questions you have with your health care provider.

## 2015-08-16 ENCOUNTER — Other Ambulatory Visit: Payer: Self-pay

## 2015-08-16 DIAGNOSIS — Z1231 Encounter for screening mammogram for malignant neoplasm of breast: Secondary | ICD-10-CM

## 2015-08-24 ENCOUNTER — Ambulatory Visit: Payer: Self-pay

## 2015-09-20 ENCOUNTER — Ambulatory Visit
Admission: RE | Admit: 2015-09-20 | Discharge: 2015-09-20 | Disposition: A | Discharge status: Home / Self Care | Payer: Medicare Other | Source: Ambulatory Visit

## 2015-09-20 DIAGNOSIS — Z1231 Encounter for screening mammogram for malignant neoplasm of breast: Secondary | ICD-10-CM

## 2016-02-20 ENCOUNTER — Encounter: Payer: Self-pay | Admitting: Gynecology

## 2016-02-20 ENCOUNTER — Ambulatory Visit (INDEPENDENT_AMBULATORY_CARE_PROVIDER_SITE_OTHER): Payer: Medicare Other | Admitting: Gynecology

## 2016-02-20 VITALS — BP 120/74

## 2016-02-20 DIAGNOSIS — N8110 Cystocele, unspecified: Secondary | ICD-10-CM

## 2016-02-20 DIAGNOSIS — N816 Rectocele: Secondary | ICD-10-CM | POA: Diagnosis not present

## 2016-02-20 DIAGNOSIS — IMO0002 Reserved for concepts with insufficient information to code with codable children: Secondary | ICD-10-CM

## 2016-02-20 NOTE — Patient Instructions (Signed)

## 2016-02-20 NOTE — Progress Notes (Signed)
    Paula [name] 01/13/1936 [id number]        80 y.o.  G2P0002 who presents complaining of a bulge from the vagina and noticed the other day. No history of same before.  No urinary or bowel complaints. No discomfort with the bulge. Saw Dr. Lily PeerFernandez 03/2015 for annual exam where a second-degree cystocele was noted.  Past medical history,surgical history, problem list, medications, allergies, family history and social history were all reviewed and documented in the EPIC chart.  Directed ROS with pertinent positives and negatives documented in the history of present illness/assessment and plan.  Exam: Paula PortelaKim [name] assistant Filed Vitals:   02/20/16 1417  BP: 120/74   General appearance:  Normal Abdomen soft nontender without masses guarding rebound Pelvic external BUS vagina with first to second-degree cystocele. First degree rectocele.  Cervix flush with upper vagina. Uterus difficult to palpate but no gross masses or tenderness. Adnexa without gross masses or tenderness.  Assessment/Plan:  80 y.o. G2P0002 with first to second-degree cystocele which I think is what she noticed. Mild rectocele also noted. I reviewed the anatomy of the situation and her cystocele. Options for management include expectant management for now as this is a new symptomatic finding to her and she does know she was heavy lifting and she noticed us afterwards. Second option would be pessary and lastly surgery. Given her age she is not overly interested in surgery but would entertain the possibility of pessary noting she is not sexually active at this time. At this point though I recommend that she just observe to see if this is going to be a consistent issue. She does note consistent symptoms she'll represent for pessary fitting. If her symptoms resolve or this is just an occasional occurrence and she'll monitor at her choice.    Paula [name],Paula [name] P MD, 2:46 PM 02/20/2016

## 2016-03-28 ENCOUNTER — Ambulatory Visit (INDEPENDENT_AMBULATORY_CARE_PROVIDER_SITE_OTHER): Payer: Medicare Other | Admitting: Gynecology

## 2016-03-28 ENCOUNTER — Encounter: Payer: Self-pay | Admitting: Gynecology

## 2016-03-28 VITALS — BP 130/78

## 2016-03-28 DIAGNOSIS — N8111 Cystocele, midline: Secondary | ICD-10-CM

## 2016-03-28 DIAGNOSIS — N816 Rectocele: Secondary | ICD-10-CM | POA: Diagnosis not present

## 2016-03-28 DIAGNOSIS — N952 Postmenopausal atrophic vaginitis: Secondary | ICD-10-CM

## 2016-03-28 DIAGNOSIS — N814 Uterovaginal prolapse, unspecified: Secondary | ICD-10-CM | POA: Diagnosis not present

## 2016-03-28 NOTE — Patient Instructions (Addendum)
Office will call you when the pessary arrives for placement 

## 2016-03-28 NOTE — Progress Notes (Signed)
    Paula [name] 10/25/1936 [id number]        80 y.o.  G2P0002 Returns having been seen last month with cystocele/rectocele noticing something bulging. She continues to notice this particular with bowel movements and other strenuous activities. She wanted to discuss what the next step would be.  Past medical history,surgical history, problem list, medications, allergies, family history and social history were all reviewed and documented in the EPIC chart.  Directed ROS with pertinent positives and negatives documented in the history of present illness/assessment and plan.  Exam: Kennon PortelaKim Gardner assistant Filed Vitals:   03/28/16 1249  BP: 130/78   General appearance:  Normal Abdomen soft nontender without masses guarding rebound Pelvic external BUS vagina with atrophic changes. Second-degree cystocele noted. First to second degree uterine prolapse. First-degree rectocele. Cervix atrophic. Uterus normal size midline mobile nontender. Adnexa without masses or tenderness  Assessment/Plan:  80 y.o. G2P0002 with pelvic relaxation as evidenced by cystocele/rectocele/uterine prolapse. Her cystocele is her major issue from a symptom like standpoint and is what is protruding at the introitus with straining. I reviewed options to include observation, pessary and surgery to include TVH A&P repair possible graft. The pros and cons of each choice reviewed with the patient. Patient is not interested in proceeding with surgery at this time but is interested in trying a pessary. Subsequently a #3 ring pessary with support was placed. The patient ambulated/squatted and did well with good support and not feeling the pessary. We will go ahead and order her this and she'll return next week for placement.    Dara LordsFONTAINE,Arijana Narayan P MD, 1:28 PM 03/28/2016

## 2016-04-12 ENCOUNTER — Encounter: Payer: Self-pay | Admitting: Gynecology

## 2016-04-12 ENCOUNTER — Ambulatory Visit (INDEPENDENT_AMBULATORY_CARE_PROVIDER_SITE_OTHER): Payer: Medicare Other | Admitting: Gynecology

## 2016-04-12 VITALS — BP 120/74

## 2016-04-12 DIAGNOSIS — N8189 Other female genital prolapse: Secondary | ICD-10-CM

## 2016-04-12 NOTE — Progress Notes (Signed)
    Paula CurlJoyce B Hulsebus 03-15-1936 [id number]        80 y.o.  G2P0002 presents for pessary placement. History of second degree cystocele, first-degree to second-degree uterine prolapse and first-degree rectocele. Was fitted for pessary and is ready to have it placed.  Past medical history,surgical history, problem list, medications, allergies, family history and social history were all reviewed and documented in the EPIC chart.  Directed ROS with pertinent positives and negatives documented in the history of present illness/assessment and plan.  Exam: Kennon PortelaKim Gardner assistant Filed Vitals:   04/12/16 1227  BP: 120/74   General appearance:  Normal Abdomen soft nontender without masses guarding rebound Pelvic external BUS vagina with atrophic changes. Second degree cystocele, first to second degree uterine prolapse, first-degree rectocele noted. Cervix atrophic. Uterus grossly normal size midline mobile nontender. Adnexa without masses or tenderness  #3 ring pessary with support was placed without difficulty. Patient ambulated and squatted without discomfort and reports good support.  Assessment/Plan:  80 y.o. G2P0002 with pelvic relaxation with good response to pessary. Will follow up in one month for follow up exam. Sooner if any issues.    Dara LordsFONTAINE,TIMOTHY P MD, 12:38 PM 04/12/2016

## 2016-04-12 NOTE — Patient Instructions (Signed)
Follow up in one month for reexamination. Sooner if any issues. 

## 2016-07-24 ENCOUNTER — Other Ambulatory Visit: Payer: Self-pay | Admitting: Internal Medicine

## 2016-07-24 DIAGNOSIS — Z1231 Encounter for screening mammogram for malignant neoplasm of breast: Secondary | ICD-10-CM

## 2016-09-21 ENCOUNTER — Ambulatory Visit
Admission: RE | Admit: 2016-09-21 | Discharge: 2016-09-21 | Disposition: A | Discharge status: Home / Self Care | Payer: Medicare Other | Source: Ambulatory Visit | Attending: Internal Medicine | Admitting: Internal Medicine

## 2016-09-21 DIAGNOSIS — Z1231 Encounter for screening mammogram for malignant neoplasm of breast: Secondary | ICD-10-CM

## 2017-03-27 ENCOUNTER — Encounter: Payer: Self-pay | Admitting: Gynecology

## 2017-08-13 ENCOUNTER — Other Ambulatory Visit: Payer: Self-pay | Admitting: Internal Medicine

## 2017-08-13 DIAGNOSIS — Z1231 Encounter for screening mammogram for malignant neoplasm of breast: Secondary | ICD-10-CM

## 2017-08-19 ENCOUNTER — Other Ambulatory Visit: Payer: Self-pay | Admitting: Internal Medicine

## 2017-08-19 DIAGNOSIS — R011 Cardiac murmur, unspecified: Secondary | ICD-10-CM

## 2017-08-21 ENCOUNTER — Ambulatory Visit (HOSPITAL_COMMUNITY): Payer: Medicare Other | Attending: Cardiology

## 2017-08-21 ENCOUNTER — Other Ambulatory Visit: Payer: Self-pay

## 2017-08-21 DIAGNOSIS — I083 Combined rheumatic disorders of mitral, aortic and tricuspid valves: Secondary | ICD-10-CM | POA: Diagnosis not present

## 2017-08-21 DIAGNOSIS — R011 Cardiac murmur, unspecified: Secondary | ICD-10-CM | POA: Diagnosis not present

## 2017-09-23 ENCOUNTER — Ambulatory Visit
Admission: RE | Admit: 2017-09-23 | Discharge: 2017-09-23 | Disposition: A | Discharge status: Home / Self Care | Payer: Medicare Other | Source: Ambulatory Visit | Attending: Internal Medicine | Admitting: Internal Medicine

## 2017-09-23 DIAGNOSIS — Z1231 Encounter for screening mammogram for malignant neoplasm of breast: Secondary | ICD-10-CM

## 2018-05-06 ENCOUNTER — Encounter: Payer: Self-pay | Admitting: Internal Medicine

## 2018-05-13 ENCOUNTER — Encounter: Payer: Self-pay | Admitting: Gastroenterology

## 2018-07-18 ENCOUNTER — Ambulatory Visit: Payer: Medicare Other | Admitting: Gastroenterology

## 2018-07-18 ENCOUNTER — Encounter (INDEPENDENT_AMBULATORY_CARE_PROVIDER_SITE_OTHER): Payer: Self-pay

## 2018-07-18 ENCOUNTER — Encounter: Payer: Self-pay | Admitting: Gastroenterology

## 2018-07-18 VITALS — BP 130/60 | HR 72 | Ht 63.25 in | Wt 149.0 lb

## 2018-07-18 DIAGNOSIS — Z800 Family history of malignant neoplasm of digestive organs: Secondary | ICD-10-CM

## 2018-07-18 NOTE — Patient Instructions (Signed)
Follow up as needed  If you are age 80 or older, your body mass index should be between 23-30. Your Body mass index is 26.19 kg/m. If this is out of the aforementioned range listed, please consider follow up with your Primary Care Provider.  If you are age 57 or younger, your body mass index should be between 19-25. Your Body mass index is 26.19 kg/m. If this is out of the aformentioned range listed, please consider follow up with your Primary Care Provider.    Thank you for choosing [facility]  Philbert Riser Nandigam,MD

## 2018-07-23 ENCOUNTER — Encounter: Payer: Self-pay | Admitting: Gastroenterology

## 2018-07-23 NOTE — Progress Notes (Signed)
Paula [name]    [id number]    05/05/1936  Primary Care Physician:Perini, Loraine Leriche, MD  Referring Physician: Rodrigo Ran, MD [address] [place], [place] [address]  Chief complaint: Colorectal cancer screening HPI: 82 year old very pleasant female here to discuss colorectal cancer screening accompanied by her husband.  She has family history of colon cancer, mother in the 59s.  Colonoscopy September 2014 was normal except for left-sided diverticulosis and prior to that colonoscopy in 2009, 2004, 2000, 1995 and 1989 negative for polyps. She denies any recent change in bowel habits, abdominal pain, nausea, vomiting, loss of appetite, weight loss, dark stool or blood in stool. Other medical history includes GERD, hypertension, osteopenia, hypercholesterolemia and dementia  Outpatient Encounter Medications as of 07/18/2018  Medication Sig  . BENAZEPRIL HCL PO Take 5 mg by mouth daily.   . Calcium Carbonate-Vitamin D (CALCIUM + D PO) Take by mouth.  . Cholecalciferol (VITAMIN D PO) Take by mouth.  . donepezil (ARICEPT) 10 MG tablet Take 10 mg by mouth at bedtime.  . ibandronate (BONIVA) 150 MG tablet Take 150 mg by mouth every 30 (thirty) days. Take in the morning with a full glass of water, on an empty stomach, and do not take anything else by mouth or lie down for the next 30 min.  . magnesium 30 MG tablet Take 750 mg by mouth daily.   . memantine (NAMENDA XR) 28 MG CP24 24 hr capsule Take 28 mg by mouth.  . Omega-3 Fatty Acids (FISH OIL) 1200 MG CAPS Take by mouth.  Marland Kitchen omeprazole (PRILOSEC) 40 MG capsule Take 20 mg by mouth daily.   . Simvastatin (ZOCOR PO) Take by mouth.  . vitamin E 600 UNIT capsule Take 600 Units by mouth daily.  . [DISCONTINUED] aspirin 81 MG tablet Take 81 mg by mouth daily.   No facility-administered encounter medications on file as of 07/18/2018.     Allergies as of 07/18/2018 - Review Complete 07/18/2018  Allergen Reaction Noted  . Propoxyphene  n-acetaminophen Hives 07/22/2008  . Hydrocodone Rash 01/15/2012    Past Medical History:  Diagnosis Date  . Diverticulosis   . Elevated cholesterol   . Hypertension   . Osteoarthritis     Past Surgical History:  Procedure Laterality Date  . BREAST BIOPSY     benign  . CARPAL TUNNEL RELEASE  2011  . REMOVAL OF VOCAL CORD POLYP    . REPLACEMENT TOTAL KNEE  2011   LEFT  . ROTATOR CUFF REPAIR      Family History  Problem Relation Age of Onset  . Hypertension Mother   . Heart disease Mother   . Colon cancer Mother 63  . Hypertension Father   . Heart disease Father   . Diabetes Sister   . Hypertension Sister   . Diabetes Sister   . Hypertension Sister   . Breast cancer Paternal Aunt        Age 80's    Social History   Socioeconomic History  . Marital status: Married    Spouse name: Not on file  . Number of children: Not on file  . Years of education: Not on file  . Highest education level: Not on file  Occupational History  . Not on file  Social Needs  . Financial resource strain: Not on file  . Food insecurity:    Worry: Not on file    Inability: Not on file  . Transportation needs:  Medical: Not on file    Non-medical: Not on file  Tobacco Use  . Smoking status: Never Smoker  . Smokeless tobacco: Never Used  Substance and Sexual Activity  . Alcohol use: No    Alcohol/week: 0.0 standard drinks  . Drug use: No  . Sexual activity: Yes    Birth control/protection: Post-menopausal  Lifestyle  . Physical activity:    Days per week: Not on file    Minutes per session: Not on file  . Stress: Not on file  Relationships  . Social connections:    Talks on phone: Not on file    Gets together: Not on file    Attends religious service: Not on file    Active member of club or organization: Not on file    Attends meetings of clubs or organizations: Not on file    Relationship status: Not on file  . Intimate partner violence:    Fear of current or ex  partner: Not on file    Emotionally abused: Not on file    Physically abused: Not on file    Forced sexual activity: Not on file  Other Topics Concern  . Not on file  Social History Narrative  . Not on file      Review of systems: Review of Systems  Constitutional: Negative for fever and chills.  HENT: Negative.   Eyes: Negative for blurred vision.  Respiratory: Negative for cough, shortness of breath and wheezing.   Cardiovascular: Negative for chest pain and palpitations.  Gastrointestinal: as per HPI Genitourinary: Negative for dysuria, urgency, frequency and hematuria.  Musculoskeletal: Negative for myalgias, back pain and joint pain.  Skin: Negative for itching and rash.  Neurological: Negative for dizziness, tremors, focal weakness, seizures and loss of consciousness.  Endo/Heme/Allergies: Negative for seasonal allergies.  Psychiatric/Behavioral: Negative for depression, suicidal ideas and hallucinations.  All other systems reviewed and are negative.   Physical Exam: Vitals:   07/18/18 1355  BP: 130/60  Pulse: 72   Body mass index is 26.19 kg/m. Gen:      No acute distress HEENT:  EOMI, sclera anicteric Neck:     No masses; no thyromegaly Lungs:    Clear to auscultation bilaterally; normal respiratory effort CV:         Regular rate and rhythm; no murmurs Abd:      + bowel sounds; soft, non-tender; no palpable masses, no distension Ext:    No edema; adequate peripheral perfusion Skin:      Warm and dry; no rash Neuro: alert and oriented x 3 Psych: normal mood and affect  Data Reviewed:  Reviewed labs, radiology imaging, old records and pertinent past GI work up   Assessment and Plan/Recommendations:  82 year old female with history of hypertension, hypercholesterolemia, osteopenia, GERD, dementia, family history of colon cancer in mother here to discuss colorectal cancer screening Patient has had multiple colonoscopies, most recent in 2014 negative for  adenomatous polyps.  Given her advanced age and comorbid conditions, do not recommend colonoscopy for colorectal cancer screening, potential risks outweigh benefits.  Discussed in detail with patient and her husband, both are in agreement. Return as needed  Greater than 50% of the time used for counseling as well as treatment plan and follow-up. She had multiple questions which were answered to her satisfaction  K. Scherry Ran , MD [phone]    CC: Rodrigo Ran, MD

## 2018-08-19 ENCOUNTER — Other Ambulatory Visit: Payer: Self-pay | Admitting: Internal Medicine

## 2018-08-19 DIAGNOSIS — Z1231 Encounter for screening mammogram for malignant neoplasm of breast: Secondary | ICD-10-CM

## 2018-09-26 ENCOUNTER — Ambulatory Visit
Admission: RE | Admit: 2018-09-26 | Discharge: 2018-09-26 | Disposition: A | Discharge status: Home / Self Care | Payer: Medicare Other | Source: Ambulatory Visit | Attending: Internal Medicine | Admitting: Internal Medicine

## 2018-09-26 DIAGNOSIS — Z1231 Encounter for screening mammogram for malignant neoplasm of breast: Secondary | ICD-10-CM

## 2018-10-06 ENCOUNTER — Ambulatory Visit (HOSPITAL_COMMUNITY)
Admission: RE | Admit: 2018-10-06 | Discharge: 2018-10-06 | Disposition: A | Discharge status: Home / Self Care | Payer: Medicare Other | Source: Ambulatory Visit | Attending: Internal Medicine | Admitting: Internal Medicine

## 2018-10-06 DIAGNOSIS — M810 Age-related osteoporosis without current pathological fracture: Secondary | ICD-10-CM | POA: Insufficient documentation

## 2018-10-06 MED ORDER — DENOSUMAB 60 MG/ML ~~LOC~~ SOSY
60.0000 mg | PREFILLED_SYRINGE | Freq: Once | SUBCUTANEOUS | Status: AC
  Administered 2018-10-06: 60 mg via SUBCUTANEOUS

## 2018-10-06 MED ORDER — DENOSUMAB 60 MG/ML ~~LOC~~ SOSY
PREFILLED_SYRINGE | SUBCUTANEOUS | Status: AC
  Administered 2018-10-06: 60 mg via SUBCUTANEOUS
  Filled 2018-10-06: qty 1

## 2018-10-06 NOTE — Discharge Instructions (Signed)
Denosumab injection °What is this medicine? °DENOSUMAB (den oh sue mab) slows bone breakdown. Prolia is used to treat osteoporosis in women after menopause and in men. Xgeva is used to treat a high calcium level due to cancer and to prevent bone fractures and other bone problems caused by multiple myeloma or cancer bone metastases. Xgeva is also used to treat giant cell tumor of the bone. °This medicine may be used for other purposes; ask your health care provider or pharmacist if you have questions. °COMMON BRAND NAME(S): Prolia, XGEVA °What should I tell my health care provider before I take this medicine? °They need to know if you have any of these conditions: °-dental disease °-having surgery or tooth extraction °-infection °-kidney disease °-low levels of calcium or Vitamin D in the blood °-malnutrition °-on hemodialysis °-skin conditions or sensitivity °-thyroid or parathyroid disease °-an unusual reaction to denosumab, other medicines, foods, dyes, or preservatives °-pregnant or trying to get pregnant °-breast-feeding °How should I use this medicine? °This medicine is for injection under the skin. It is given by a health care professional in a hospital or clinic setting. °If you are getting Prolia, a special MedGuide will be given to you by the pharmacist with each prescription and refill. Be sure to read this information carefully each time. °For Prolia, talk to your pediatrician regarding the use of this medicine in children. Special care may be needed. For Xgeva, talk to your pediatrician regarding the use of this medicine in children. While this drug may be prescribed for children as young as 13 years for selected conditions, precautions do apply. °Overdosage: If you think you have taken too much of this medicine contact a poison control center or emergency room at once. °NOTE: This medicine is only for you. Do not share this medicine with others. °What if I miss a dose? °It is important not to miss your  dose. Call your doctor or health care professional if you are unable to keep an appointment. °What may interact with this medicine? °Do not take this medicine with any of the following medications: °-other medicines containing denosumab °This medicine may also interact with the following medications: °-medicines that lower your chance of fighting infection °-steroid medicines like prednisone or cortisone °This list may not describe all possible interactions. Give your health care provider a list of all the medicines, herbs, non-prescription drugs, or dietary supplements you use. Also tell them if you smoke, drink alcohol, or use illegal drugs. Some items may interact with your medicine. °What should I watch for while using this medicine? °Visit your doctor or health care professional for regular checks on your progress. Your doctor or health care professional may order blood tests and other tests to see how you are doing. °Call your doctor or health care professional for advice if you get a fever, chills or sore throat, or other symptoms of a cold or flu. Do not treat yourself. This drug may decrease your body's ability to fight infection. Try to avoid being around people who are sick. °You should make sure you get enough calcium and vitamin D while you are taking this medicine, unless your doctor tells you not to. Discuss the foods you eat and the vitamins you take with your health care professional. °See your dentist regularly. Brush and floss your teeth as directed. Before you have any dental work done, tell your dentist you are receiving this medicine. °Do not become pregnant while taking this medicine or for 5 months after stopping   it. Talk with your doctor or health care professional about your birth control options while taking this medicine. Women should inform their doctor if they wish to become pregnant or think they might be pregnant. There is a potential for serious side effects to an unborn child. Talk  to your health care professional or pharmacist for more information. What side effects may I notice from receiving this medicine? Side effects that you should report to your doctor or health care professional as soon as possible: -allergic reactions like skin rash, itching or hives, swelling of the face, lips, or tongue -bone pain -breathing problems -dizziness -jaw pain, especially after dental work -redness, blistering, peeling of the skin -signs and symptoms of infection like fever or chills; cough; sore throat; pain or trouble passing urine -signs of low calcium like fast heartbeat, muscle cramps or muscle pain; pain, tingling, numbness in the hands or feet; seizures -unusual bleeding or bruising -unusually weak or tired Side effects that usually do not require medical attention (report to your doctor or health care professional if they continue or are bothersome): -constipation -diarrhea -headache -joint pain -loss of appetite -muscle pain -runny nose -tiredness -upset stomach This list may not describe all possible side effects. Call your doctor for medical advice about side effects. You may report side effects to FDA at 1-800-FDA-1088. Where should I keep my medicine? This medicine is only given in a clinic, doctor's office, or other health care setting and will not be stored at home. NOTE: This sheet is a summary. It may not cover all possible information. If you have questions about this medicine, talk to your doctor, pharmacist, or health care provider.  2018 [employer]vier/Gold Standard (2016-11-20 19:17:21)

## 2018-10-08 ENCOUNTER — Encounter: Payer: Self-pay | Admitting: Podiatry

## 2018-10-08 ENCOUNTER — Ambulatory Visit: Payer: Medicare Other | Admitting: Podiatry

## 2018-10-08 VITALS — BP 143/95

## 2018-10-08 DIAGNOSIS — B351 Tinea unguium: Secondary | ICD-10-CM

## 2018-10-08 DIAGNOSIS — M79674 Pain in right toe(s): Secondary | ICD-10-CM | POA: Diagnosis not present

## 2018-10-08 DIAGNOSIS — G609 Hereditary and idiopathic neuropathy, unspecified: Secondary | ICD-10-CM

## 2018-10-08 DIAGNOSIS — L84 Corns and callosities: Secondary | ICD-10-CM | POA: Diagnosis not present

## 2018-10-08 DIAGNOSIS — M79675 Pain in left toe(s): Secondary | ICD-10-CM

## 2018-10-08 NOTE — Patient Instructions (Addendum)
Corns and Calluses Corns are small areas of thickened skin that occur on the top, sides, or tip of a toe. They contain a cone-shaped core with a point that can press on a nerve below. This causes pain. Calluses are areas of thickened skin that can occur anywhere on the body including hands, fingers, palms, soles of the feet, and heels.Calluses are usually larger than corns. What are the causes? Corns and calluses are caused by rubbing (friction) or pressure, such as from shoes that are too tight or do not fit properly. What increases the risk? Corns are more likely to develop in people who have toe deformities, such as hammer toes. Since calluses can occur with friction to any area of the skin, calluses are more likely to develop in people who:  Work with their hands.  Wear shoes that fit poorly, shoes that are too tight, or shoes that are high-heeled.  Have toes deformities.  What are the signs or symptoms? Symptoms of a corn or callus include:  A hard growth on the skin.  Pain or tenderness under the skin.  Redness and swelling.  Increased discomfort while wearing tight-fitting shoes.  How is this diagnosed? Corns and calluses may be diagnosed with a medical history and physical exam. How is this treated? Corns and calluses may be treated with:  Removing the cause of the friction or pressure. This may include: ? Changing your shoes. ? Wearing shoe inserts (orthotics) or other protective layers in your shoes, such as a corn pad. ? Wearing gloves.  Medicines to help soften skin in the hardened, thickened areas.  Reducing the size of the corn or callus by removing the dead layers of skin.  Antibiotic medicines to treat infection.  Surgery, if a toe deformity is the cause.  Follow these instructions at home:  Take medicines only as directed by your health care provider.  If you were prescribed an antibiotic, finish all of it even if you start to feel better.  Wear  shoes that fit well. Avoid wearing high-heeled shoes and shoes that are too tight or too loose.  Wear any padding, protective layers, gloves, or orthotics as directed by your health care provider.  Soak your hands or feet and then use a file or pumice stone to soften your corn or callus. Do this as directed by your health care provider.  Check your corn or callus every day for signs of infection. Watch for: ? Redness, swelling, or pain. ? Fluid, blood, or pus. Contact a health care provider if:  Your symptoms do not improve with treatment.  You have increased redness, swelling, or pain at the site of your corn or callus.  You have fluid, blood, or pus coming from your corn or callus.  You have new symptoms. This information is not intended to replace advice given to you by your health care provider. Make sure you discuss any questions you have with your health care provider. Document Released: 08/04/2004 Document Revised: 05/18/2016 Document Reviewed: 10/25/2014 Elsevier Interactive Patient Education  2018 [employer]  Onychomycosis/Fungal Toenails  WHAT IS IT? An infection that lies within the keratin of your nail plate that is caused by a fungus.  WHY ME? Fungal infections affect all ages, sexes, races, and creeds.  There may be many factors that predispose you to a fungal infection such as age, coexisting medical conditions such as diabetes, or an autoimmune disease; stress, medications, fatigue, genetics, etc.  Bottom line: fungus thrives in a warm, moist  environment and your shoes offer such a location.  IS IT CONTAGIOUS? Theoretically, yes.  You do not want to share shoes, nail clippers or files with someone who has fungal toenails.  Walking around barefoot in the same room or sleeping in the same bed is unlikely to transfer the organism.  It is important to realize, however, that fungus can spread easily from one nail to the next on the same foot.  HOW DO WE TREAT THIS?  There  are several ways to treat this condition.  Treatment may depend on many factors such as age, medications, pregnancy, liver and kidney conditions, etc.  It is best to ask your doctor which options are available to you.  1. No treatment.   Unlike many other medical concerns, you can live with this condition.  However for many people this can be a painful condition and may lead to ingrown toenails or a bacterial infection.  It is recommended that you keep the nails cut short to help reduce the amount of fungal nail. 2. Topical treatment.  These range from herbal remedies to prescription strength nail lacquers.  About 40-50% effective, topicals require twice daily application for approximately 9 to 12 months or until an entirely new nail has grown out.  The most effective topicals are medical grade medications available through physicians offices. 3. Oral antifungal medications.  With an 80-90% cure rate, the most common oral medication requires 3 to 4 months of therapy and stays in your system for a year as the new nail grows out.  Oral antifungal medications do require blood work to make sure it is a safe drug for you.  A liver function panel will be performed prior to starting the medication and after the first month of treatment.  It is important to have the blood work performed to avoid any harmful side effects.  In general, this medication safe but blood work is required. 4. Laser Therapy.  This treatment is performed by applying a specialized laser to the affected nail plate.  This therapy is noninvasive, fast, and non-painful.  It is not covered by insurance and is therefore, out of pocket.  The results have been very good with a 80-95% cure rate.  [facility] is the only practice in the area to offer this therapy. 5. Permanent Nail Avulsion.  Removing the entire nail so that a new nail will not grow back.  Diabetic Neuropathy Diabetic neuropathy is a nerve disease or nerve damage that is  caused by diabetes mellitus. About half of all people with diabetes mellitus have some form of nerve damage. Nerve damage is more common in those who have had diabetes mellitus for many years and who generally have not had good control of their blood sugar (glucose) level. Diabetic neuropathy is a common complication of diabetes mellitus. There are three common types of diabetic neuropathy and a fourth type that is less common and less understood:  Peripheral neuropathy-This is the most common type of diabetic neuropathy. It causes damage to the nerves of the feet and legs first and then eventually the hands and arms. The damage affects the ability to sense touch.  Autonomic neuropathy-This type causes damage to the autonomic nervous system, which controls the following functions: ? Heartbeat. ? Body temperature. ? Blood pressure. ? Urination. ? Digestion. ? Sweating. ? Sexual function.  Focal neuropathy-Focal neuropathy can be painful and unpredictable and occurs most often in older adults with diabetes mellitus. It involves a specific nerve  or one area and often comes on suddenly. It usually does not cause long-term problems.  Radiculoplexus neuropathy- Sometimes called lumbosacral radiculoplexus neuropathy, radiculoplexus neuropathy affects the nerves of the thighs, hips, buttocks, or legs. It is more common in people with type 2 diabetes mellitus and in older men. It is characterized by debilitating pain, weakness, and atrophy, usually in the thigh muscles.  What are the causes? The cause of peripheral, autonomic, and focal neuropathies is diabetes mellitus that is uncontrolled and high glucose levels. The cause of radiculoplexus neuropathy is unknown. However, it is thought to be caused by inflammation related to uncontrolled glucose levels. What are the signs or symptoms? Peripheral Neuropathy Peripheral neuropathy develops slowly over time. When the nerves of the feet and legs no longer  work there may be:  Burning, stabbing, or aching pain in the legs or feet.  Inability to feel pressure or pain in your feet. This can lead to: ? Thick calluses over pressure areas. ? Pressure sores. ? Ulcers.  Foot deformities.  Reduced ability to feel temperature changes.  Muscle weakness.  Autonomic Neuropathy The symptoms of autonomic neuropathy vary depending on which nerves are affected. Symptoms may include:  Problems with digestion, such as: ? Feeling sick to your stomach (nausea). ? Vomiting. ? Bloating. ? Constipation. ? Diarrhea. ? Abdominal pain.  Difficulty with urination. This occurs if you lose your ability to sense when your bladder is full. Problems include: ? Urine leakage (incontinence). ? Inability to empty your bladder completely (retention).  Rapid or irregular heartbeat (palpitations).  Blood pressure drops when you stand up (orthostatic hypotension). When you stand up you may feel: ? Dizzy. ? Weak. ? Faint.  In men, inability to attain and maintain an erection.  In women, vaginal dryness and problems with decreased sexual desire and arousal.  Problems with body temperature regulation.  Increased or decreased sweating.  Focal Neuropathy  Abnormal eye movements or abnormal alignment of both eyes.  Weakness in the wrist.  Foot drop. This results in an inability to lift the foot properly and abnormal walking or foot movement.  Paralysis on one side of your face (Bell palsy).  Chest or abdominal pain. Radiculoplexus Neuropathy  Sudden, severe pain in your hip, thigh, or buttocks.  Weakness and wasting of thigh muscles.  Difficulty rising from a seated position.  Abdominal swelling.  Unexplained weight loss (usually more than 10 lb [4.5 kg]). How is this diagnosed? Peripheral Neuropathy Your senses may be tested. Sensory function testing can be done with:  A light touch using a monofilament.  A vibration with tuning fork.  A  sharp sensation with a pin prick.  Other tests that can help diagnose neuropathy are:  Nerve conduction velocity. This test checks the transmission of an electrical current through a nerve.  Electromyography. This shows how muscles respond to electrical signals transmitted by nearby nerves.  Quantitative sensory testing. This is used to assess how your nerves respond to vibrations and changes in temperature.  Autonomic Neuropathy Diagnosis is often based on reported symptoms. Tell your health care provider if you experience:  Dizziness.  Constipation.  Diarrhea.  Inappropriate urination or inability to urinate.  Inability to get or maintain an erection.  Tests that may be done include:  Electrocardiography or Holter monitor. These are tests that can help show problems with the heart rate or heart rhythm.  An X-ray exam may be done.  Focal Neuropathy Diagnosis is made based on your symptoms and what your health  care provider finds during your exam. Other tests may be done. They may include:  Nerve conduction velocities. This checks the transmission of electrical current through a nerve.  Electromyography. This shows how muscles respond to electrical signals transmitted by nearby nerves.  Quantitative sensory testing. This test is used to assess how your nerves respond to vibration and changes in temperature.  Radiculoplexus Neuropathy  Often the first thing is to eliminate any other issue or problems that might be the cause, as there is no standard test for diagnosis.  X-ray exam of your spine and lumbar region.  Spinal tap to rule out cancer.  MRI to rule out other lesions. How is this treated? Once nerve damage occurs, it cannot be reversed. The goal of treatment is to keep the disease or nerve damage from getting worse and affecting more nerve fibers. Controlling your blood glucose level is the key. Most people with radiculoplexus neuropathy see at least a partial  improvement over time. You will need to keep your blood glucose and HbA1c levels in the target range determined by your health care provider. Things that help control blood glucose levels include:  Blood glucose monitoring.  Meal planning.  Physical activity.  Diabetes medicine.  Over time, maintaining lower blood glucose levels helps lessen symptoms. Sometimes, prescription pain medicine is needed. Follow these instructions at home:  Do not smoke.  Keep your blood glucose level in the range that you and your health care provider have determined acceptable for you.  Keep your blood pressure level in the range that you and your health care provider have determined acceptable for you.  Eat a well-balanced diet.  Be physically active every day. Include strength training and balance exercises.  Protect your feet. ? Check your feet every day for sores, cuts, blisters, or signs of infection. ? Wear padded socks and supportive shoes. Use orthotic inserts, if necessary. ? Regularly check the insides of your shoes for worn spots. Make sure there are no rocks or other items inside your shoes before you put them on. Contact a health care provider if:  You have burning, stabbing, or aching pain in the legs or feet.  You are unable to feel pressure or pain in your feet.  You develop problems with digestion such as: ? Nausea. ? Vomiting. ? Bloating. ? Constipation. ? Diarrhea. ? Abdominal pain.  You have difficulty with urination, such as: ? Incontinence. ? Retention.  You have palpitations.  You develop orthostatic hypotension. When you stand up you may feel: ? Dizzy. ? Weak. ? Faint.  You cannot attain and maintain an erection (in men).  You have vaginal dryness and problems with decreased sexual desire and arousal (in women).  You have severe pain in your thighs, legs, or buttocks.  You have unexplained weight loss. This information is not intended to replace advice  given to you by your health care provider. Make sure you discuss any questions you have with your health care provider. Document Released: 01/07/2002 Document Revised: 04/05/2016 Document Reviewed: 04/09/2013 Elsevier Interactive Patient Education  2017 [employer].

## 2018-10-29 ENCOUNTER — Encounter: Payer: Self-pay | Admitting: Podiatry

## 2018-10-29 NOTE — Progress Notes (Signed)
Subjective: Paula [name] presents today referred by Rodrigo Ran, MD with cc of painful, discolored, thick toenails which interfere with daily activities.  Duration is greater than 2 months.  Pain is aggravated when wearing enclosed shoe gear.  Past Medical History:  Diagnosis Date  . Diverticulosis   . Elevated cholesterol   . Hypertension   . Osteoarthritis     Patient Active Problem List   Diagnosis Date Noted  . Osteopenia 04/01/2015  . Cystocele, grade 2 04/01/2015  . Nocturia 03/24/2013  . Vaginal atrophy 03/24/2013  . Memory changes 11/26/2012  . Osteoarthritis   . Elevated cholesterol   . Hypertension     Past Surgical History:  Procedure Laterality Date  . BREAST BIOPSY     benign  . CARPAL TUNNEL RELEASE  2011  . REMOVAL OF VOCAL CORD POLYP    . REPLACEMENT TOTAL KNEE  2011   LEFT  . ROTATOR CUFF REPAIR       Current Outpatient Medications:  .  alendronate (FOSAMAX) 70 MG tablet, , Disp: , Rfl:  .  BENAZEPRIL HCL PO, Take 5 mg by mouth daily. , Disp: , Rfl:  .  Calcium Carbonate-Vitamin D (CALCIUM + D PO), Take by mouth., Disp: , Rfl:  .  Cholecalciferol (VITAMIN D PO), Take by mouth., Disp: , Rfl:  .  donepezil (ARICEPT) 10 MG tablet, Take 10 mg by mouth at bedtime., Disp: , Rfl:  .  ibandronate (BONIVA) 150 MG tablet, Take 150 mg by mouth every 30 (thirty) days. Take in the morning with a full glass of water, on an empty stomach, and do not take anything else by mouth or lie down for the next 30 min., Disp: , Rfl:  .  magnesium 30 MG tablet, Take 750 mg by mouth daily. , Disp: , Rfl:  .  memantine (NAMENDA XR) 28 MG CP24 24 hr capsule, Take 28 mg by mouth., Disp: , Rfl:  .  Omega-3 Fatty Acids (FISH OIL) 1200 MG CAPS, Take by mouth., Disp: , Rfl:  .  omeprazole (PRILOSEC) 40 MG capsule, Take 20 mg by mouth daily. , Disp: , Rfl:  .  Simvastatin (ZOCOR PO), Take by mouth., Disp: , Rfl:  .  vitamin E 600 UNIT capsule, Take 600 Units by mouth daily., Disp: ,  Rfl:   Allergies  Allergen Reactions  . Meperidine Other (See Comments)    Unknown to patient  . Propoxyphene Hives  . Propoxyphene N-Acetaminophen Hives    Darvocet=REACTION: hives, itching  States  Can take tylenol by itself  . Hydrocodone Rash    Social History   Occupational History  . Not on file  Tobacco Use  . Smoking status: Never Smoker  . Smokeless tobacco: Never Used  Substance and Sexual Activity  . Alcohol use: No    Alcohol/week: 0.0 standard drinks  . Drug use: No  . Sexual activity: Yes    Birth control/protection: Post-menopausal    Family History  Problem Relation Age of Onset  . Hypertension Mother   . Heart disease Mother   . Colon cancer Mother 21  . Hypertension Father   . Heart disease Father   . Diabetes Sister   . Hypertension Sister   . Diabetes Sister   . Hypertension Sister   . Breast cancer Paternal Aunt        Age 49's     There is no immunization history on file for this patient.   Review of systems: Positive Findings  in bold print.  Constitutional:  chills, fatigue, fever, sweats, weight change Communication: Nurse, learning disabilitytranslator, sign Presenter, broadcastinglanguage translator, hand writing, iPad/Android device Eyes: diplopia, glare,  light sensitivity, eyeglasses, blindness Ears nose mouth throat: Hard of hearing, deaf, sign language,  vertigo,   bloody nose,  rhinitis,  cold sores, snoring Cardiovascular: HTN, edema, arrhythmia, pacemaker in place, defibrillator in place,  chest pain/tightness, chronic anticoagulation, blood clot Respiratory:  difficulty breathing, denies congestion, SOB, wheezing, cough Gastrointestinal: abdominal pain, diarrhea, nausea, vomiting,  Genitourinary:  nocturia,  pain on urination,  blood in urine, Foley catheter, urinary urgency Musculoskeletal: Uses mobility aid,  cramping, stiff joints, painful joints,  Skin: +changes in toenails, color change dryness, itchy skin, mole changes, or rash  Neurological: numbness,  paresthesias, burning in feet, denies fainting,  seizure, change in speech. denies headaches, memory problems/poor historian, cerebral palsy Endocrine: diabetes, hypothyroidism, hyperthyroidism,  dry mouth, flushing, denies heat intolerance,  cold intolerance,  excessive thirst, denies polyuria,  nocturia Hematological:  easy bleeding,  excessive bleeding, easy bruising, enlarged lymph nodes, on long term blood thinner Allergy/immunological:  hive, frequent infections, multiple drug allergies, seasonal allergies,  Psychiatric:  anxiety, depression, mood disorder, suicidal ideations, hallucinations   Objective: Vascular Examination: Capillary refill time immediate x 10 digits Dorsalis pedis 2/4 bilaterally Posterior tibial pulses faintly palpable b/l No digital hair x 10 digits Skin temperature gradient WNL b/l  Dermatological Examination: Skin with normal turgor, texture and tone b/l  Toenails 1-5 b/l discolored, thick, dystrophic with subungual debris and pain with palpation to nailbeds due to thickness of nails.  Hyperkeratotic lesion noted plantar medial aspect of right hallux.  No erythema, no edema, no drainage, no flocculence noted.  Musculoskeletal: Muscle strength 5/5 to all LE muscle groups  Neurological: Sensation diminished with 10 gram monofilament Vibratory sensation diminished  Assessment: 1. Painful onychomycosis toenails 1-5 b/l  2. Callus right hallux 3. Peripheral neuropathy   Plan: 1. Discussed onychomycosis and treatment options.  Literature dispensed on today. 2. Toenails 1-5 b/l were debrided in length and girth without iatrogenic bleeding. 3. Callus of the right hallux was pared with chisel blade and gently smoothed with burr without incident. 4. Patient to continue soft, supportive shoe gear 5. Patient to report any pedal injuries to medical professional immediately. 6. Follow up 3 months. Patient/POA to call should there be a concern in the interim.

## 2019-01-07 ENCOUNTER — Ambulatory Visit: Payer: Medicare Other | Admitting: Podiatry

## 2019-04-01 ENCOUNTER — Other Ambulatory Visit: Payer: Self-pay

## 2019-04-02 ENCOUNTER — Other Ambulatory Visit (HOSPITAL_COMMUNITY): Payer: Self-pay | Admitting: *Deleted

## 2019-04-02 ENCOUNTER — Ambulatory Visit (HOSPITAL_COMMUNITY)
Admission: RE | Admit: 2019-04-02 | Discharge: 2019-04-02 | Disposition: A | Discharge status: Home / Self Care | Payer: Medicare Other | Source: Ambulatory Visit | Attending: Internal Medicine | Admitting: Internal Medicine

## 2019-04-02 DIAGNOSIS — M810 Age-related osteoporosis without current pathological fracture: Secondary | ICD-10-CM | POA: Insufficient documentation

## 2019-04-02 MED ORDER — DENOSUMAB 60 MG/ML ~~LOC~~ SOSY
PREFILLED_SYRINGE | SUBCUTANEOUS | Status: AC
  Administered 2019-04-02: 60 mg via SUBCUTANEOUS
  Filled 2019-04-02: qty 1

## 2019-04-02 MED ORDER — DENOSUMAB 60 MG/ML ~~LOC~~ SOSY: 60.0000 mg | PREFILLED_SYRINGE | Freq: Once | SUBCUTANEOUS | Status: AC

## 2019-08-18 ENCOUNTER — Other Ambulatory Visit: Payer: Self-pay | Admitting: Internal Medicine

## 2019-08-18 DIAGNOSIS — Z1231 Encounter for screening mammogram for malignant neoplasm of breast: Secondary | ICD-10-CM

## 2019-08-20 ENCOUNTER — Encounter: Payer: Self-pay | Admitting: Gynecology

## 2019-10-02 ENCOUNTER — Ambulatory Visit
Admission: RE | Admit: 2019-10-02 | Discharge: 2019-10-02 | Disposition: A | Discharge status: Home / Self Care | Payer: Medicare Other | Source: Ambulatory Visit | Attending: Internal Medicine | Admitting: Internal Medicine

## 2019-10-02 ENCOUNTER — Other Ambulatory Visit: Payer: Self-pay

## 2019-10-02 DIAGNOSIS — Z1231 Encounter for screening mammogram for malignant neoplasm of breast: Secondary | ICD-10-CM

## 2019-11-09 ENCOUNTER — Telehealth (HOSPITAL_COMMUNITY): Payer: Self-pay | Admitting: *Deleted

## 2019-11-09 NOTE — Telephone Encounter (Signed)
Attempted to reach pt to schedule ABI ordered by Dr. Joylene Draft

## 2019-11-09 NOTE — Telephone Encounter (Signed)
Unable to reach pt to schedule appt 1:28pm

## 2019-11-10 ENCOUNTER — Telehealth (HOSPITAL_COMMUNITY): Payer: Self-pay | Admitting: *Deleted

## 2019-11-10 NOTE — Telephone Encounter (Signed)
Attempted to reach pt to schedule appt for ABI ordered by Dr. Joylene Draft. No answer and no VM

## 2019-11-12 ENCOUNTER — Telehealth (HOSPITAL_COMMUNITY): Payer: Self-pay | Admitting: *Deleted

## 2019-11-12 NOTE — Telephone Encounter (Signed)
4th attempt to reach patient to schedule appt.

## 2019-11-16 ENCOUNTER — Telehealth (HOSPITAL_COMMUNITY): Payer: Self-pay | Admitting: *Deleted

## 2019-11-16 NOTE — Telephone Encounter (Signed)
Left message for Paula [name]. Unable to reach pt to schedule appt

## 2019-12-04 ENCOUNTER — Ambulatory Visit: Payer: Medicare Other | Attending: Internal Medicine

## 2019-12-04 DIAGNOSIS — Z23 Encounter for immunization: Secondary | ICD-10-CM | POA: Insufficient documentation

## 2019-12-04 NOTE — Progress Notes (Signed)
   Covid-19 Vaccination Clinic  Name:  DIGNA COUNTESS    MRN: [id number] DOB: 1936-08-09  12/04/2019  Ms. Ruben was observed post Covid-19 immunization for 15 minutes without incidence. She was provided with Vaccine Information Sheet and instruction to access the V-Safe system.   Ms. Gulotta was instructed to call 911 with any severe reactions post vaccine: Marland Kitchen Difficulty breathing  . Swelling of your face and throat  . A fast heartbeat  . A bad rash all over your body  . Dizziness and weakness    Immunizations Administered    Name Date Dose VIS Date Route   Pfizer COVID-19 Vaccine 12/04/2019  3:20 PM 0.3 mL 10/23/2019 Intramuscular   Manufacturer: [employer], [employer]   Lot: [id number]   NDC: [address]-5

## 2019-12-24 ENCOUNTER — Ambulatory Visit: Payer: Medicare Other | Attending: Internal Medicine

## 2019-12-24 DIAGNOSIS — Z23 Encounter for immunization: Secondary | ICD-10-CM

## 2019-12-24 NOTE — Progress Notes (Signed)
   Covid-19 Vaccination Clinic  Name:  BLU MCGLAUN    MRN: [id number] DOB: 1936/05/18  12/24/2019  Ms. Velador was observed post Covid-19 immunization for 15 minutes without incidence. She was provided with Vaccine Information Sheet and instruction to access the V-Safe system.   Ms. Gawlik was instructed to call 911 with any severe reactions post vaccine: Marland Kitchen Difficulty breathing  . Swelling of your face and throat  . A fast heartbeat  . A bad rash all over your body  . Dizziness and weakness    Immunizations Administered    Name Date Dose VIS Date Route   Pfizer COVID-19 Vaccine 12/24/2019 10:48 AM 0.3 mL 10/23/2019 Intramuscular   Manufacturer: [employer], [employer]   Lot: [id number]   NDC: [address]-0

## 2020-02-11 ENCOUNTER — Emergency Department (HOSPITAL_COMMUNITY): Payer: Medicare Other

## 2020-02-11 ENCOUNTER — Other Ambulatory Visit: Payer: Self-pay

## 2020-02-11 ENCOUNTER — Encounter (HOSPITAL_COMMUNITY): Payer: Self-pay | Admitting: Emergency Medicine

## 2020-02-11 ENCOUNTER — Emergency Department (HOSPITAL_COMMUNITY)
Admission: EM | Admit: 2020-02-11 | Discharge: 2020-02-11 | Disposition: A | Discharge status: Home / Self Care | Payer: Medicare Other | Attending: Emergency Medicine | Admitting: Emergency Medicine

## 2020-02-11 DIAGNOSIS — Z79899 Other long term (current) drug therapy: Secondary | ICD-10-CM | POA: Insufficient documentation

## 2020-02-11 DIAGNOSIS — I10 Essential (primary) hypertension: Secondary | ICD-10-CM | POA: Diagnosis not present

## 2020-02-11 DIAGNOSIS — R4182 Altered mental status, unspecified: Secondary | ICD-10-CM

## 2020-02-11 DIAGNOSIS — G309 Alzheimer's disease, unspecified: Secondary | ICD-10-CM | POA: Insufficient documentation

## 2020-02-11 HISTORY — DX: Dementia in other diseases classified elsewhere, unspecified severity, without behavioral disturbance, psychotic disturbance, mood disturbance, and anxiety: F02.80

## 2020-02-11 LAB — COMPREHENSIVE METABOLIC PANEL
ALT: 23 U/L (ref 0–44)
AST: 64 U/L — ABNORMAL HIGH (ref 15–41)
Albumin: 3.8 g/dL (ref 3.5–5.0)
Alkaline Phosphatase: 63 U/L (ref 38–126)
Anion gap: 10 (ref 5–15)
BUN: 14 mg/dL (ref 8–23)
CO2: 28 mmol/L (ref 22–32)
Calcium: 9.4 mg/dL (ref 8.9–10.3)
Chloride: 102 mmol/L (ref 98–111)
Creatinine, Ser: 0.8 mg/dL (ref 0.44–1.00)
GFR calc Af Amer: >60 mL/min (ref >60)
GFR calc non Af Amer: >60 mL/min (ref >60)
Glucose, Bld: 101 mg/dL — ABNORMAL HIGH (ref 70–99)
Potassium: 4.1 mmol/L (ref 3.5–5.1)
Sodium: 140 mmol/L (ref 135–145)
Total Bilirubin: 0.8 mg/dL (ref 0.3–1.2)
Total Protein: 6.6 g/dL (ref 6.5–8.1)

## 2020-02-11 LAB — CBC
HCT: 38.9 % (ref 36.0–46.0)
Hemoglobin: 12.7 g/dL (ref 12.0–15.0)
MCH: 30.4 pg (ref 26.0–34.0)
MCHC: 32.6 g/dL (ref 30.0–36.0)
MCV: 93.1 fL (ref 80.0–100.0)
Platelets: 224 10*3/uL (ref 150–400)
RBC: 4.18 MIL/uL (ref 3.87–5.11)
RDW: 13.7 % (ref 11.5–15.5)
WBC: 7.4 10*3/uL (ref 4.0–10.5)
nRBC: 0 % (ref 0.0–0.2)

## 2020-02-11 LAB — URINALYSIS, ROUTINE W REFLEX MICROSCOPIC
Bilirubin Urine: NEGATIVE
Glucose, UA: NEGATIVE mg/dL
Hgb urine dipstick: NEGATIVE
Ketones, ur: 20 mg/dL — AB
Leukocytes,Ua: NEGATIVE
Nitrite: NEGATIVE
Protein, ur: NEGATIVE mg/dL
Specific Gravity, Urine: 1.017 (ref 1.005–1.030)
pH: 5 (ref 5.0–8.0)

## 2020-02-11 LAB — MAGNESIUM: Magnesium: 2.1 mg/dL (ref 1.7–2.4)

## 2020-02-11 LAB — CBG MONITORING, ED: Glucose-Capillary: 91 mg/dL (ref 70–99)

## 2020-02-11 LAB — TSH: TSH: 2.089 u[IU]/mL (ref 0.350–4.500)

## 2020-02-11 NOTE — Care Management (Signed)
Patient has [facility] insurance and tomorrow is on holiday schedule HH may not be able to start care until after the holiday, CM contacted [facility] spoke with Dewayne Hatch referral faxed over a nurse will follow up with patient and spouse tomorrow, As per husband Patient Alzheimer is becoming progressively worst. Updated Patient and husband

## 2020-02-11 NOTE — ED Notes (Signed)
Case Manager Burna Mortimer) in to talk with pt and pt husband

## 2020-02-11 NOTE — ED Triage Notes (Signed)
Pt to ED  From Home via [facility]   EMS reports pt lives with her husband and daughter.  Pt has hx of Alzheimer's and has not taken her meds in past two weeks.  Family st's pt has became more confused over past few days.  Has forgotten how to walk.   Refuses to take her medications

## 2020-02-11 NOTE — Care Management (Signed)
ED CM/ CSW [facility] )received consult from [facility] in the ED.  Patient brought in with AMS, patient has hx of Alzheimer, and husband reports symptoms have increased, patient is forgetting to walk.  CM  reviewed patient record and spoke with EDP, ED workup still in progress.

## 2020-02-11 NOTE — ED Provider Notes (Signed)
[facility] EMERGENCY DEPARTMENT Provider Note   CSN: [id number] Arrival date & time: 02/11/20  1610     History Chief Complaint  Patient presents with  . Altered Mental Status    Paula [name] is a 84 y.o. female.  HPI Patient is an 84 year old female with a PMH of hyperlipidemia, hypertension and Alzheimer's disease presenting to the ED today with her husband due to altered mental status.  Patient's husband reports that over the past week and a half, she has become progressively more confused.  He says that it seems as though she has forgotten how to walk and it has recently taken her an hour and a half to walk up the stairs.  Patient's husband says that he lays all of her medications out for her every morning but that patient has refused to take them for the past 2 weeks.  She has also had multiple episodes of urinary incontinence over the past month.  Husband denies any recent falls or injuries.  Patient herself has no complaints.  She is alert to self but not to time, place or situation.    Past Medical History:  Diagnosis Date  . Alzheimer disease (HCC)   . Diverticulosis   . Elevated cholesterol   . Hypertension   . Osteoarthritis     Patient Active Problem List   Diagnosis Date Noted  . Osteopenia 04/01/2015  . Cystocele, grade 2 04/01/2015  . Nocturia 03/24/2013  . Vaginal atrophy 03/24/2013  . Memory changes 11/26/2012  . Osteoarthritis   . Elevated cholesterol   . Hypertension     Past Surgical History:  Procedure Laterality Date  . BREAST BIOPSY     benign  . CARPAL TUNNEL RELEASE  2011  . REMOVAL OF VOCAL CORD POLYP    . REPLACEMENT TOTAL KNEE  2011   LEFT  . ROTATOR CUFF REPAIR       OB History    Gravida  2   Para  2   Term      Preterm      AB  0   Living  2     SAB      TAB      Ectopic      Multiple      Live Births              Family History  Problem Relation Age of Onset  . Hypertension Mother     . Heart disease Mother   . Colon cancer Mother 57  . Hypertension Father   . Heart disease Father   . Diabetes Sister   . Hypertension Sister   . Diabetes Sister   . Hypertension Sister   . Breast cancer Paternal Aunt        Age 47's    Social History   Tobacco Use  . Smoking status: Never Smoker  . Smokeless tobacco: Never Used  Substance Use Topics  . Alcohol use: No    Alcohol/week: 0.0 standard drinks  . Drug use: No    Home Medications Prior to Admission medications   Medication Sig Start Date End Date Taking? Authorizing Provider  alendronate (FOSAMAX) 70 MG tablet  07/25/18   [provider]  benazepril (LOTENSIN) 5 MG tablet Take 5 mg by mouth daily. 01/30/20   [provider]  BENAZEPRIL HCL PO Take 5 mg by mouth daily.     [provider]  Calcium Carbonate-Vitamin D (CALCIUM + D PO) Take  by mouth.    [provider]  Cholecalciferol (VITAMIN D PO) Take by mouth.    [provider]  donepezil (ARICEPT) 10 MG tablet Take 10 mg by mouth at bedtime.    [provider]  ibandronate (BONIVA) 150 MG tablet Take 150 mg by mouth every 30 (thirty) days. Take in the morning with a full glass of water, on an empty stomach, and do not take anything else by mouth or lie down for the next 30 min.    [provider]  magnesium 30 MG tablet Take 750 mg by mouth daily.     [provider]  memantine (NAMENDA XR) 28 MG CP24 24 hr capsule Take 28 mg by mouth.    [provider]  Omega-3 Fatty Acids (FISH OIL) 1200 MG CAPS Take by mouth.    [provider]  omeprazole (PRILOSEC) 20 MG capsule Take 20 mg by mouth daily. 01/30/20   [provider]  omeprazole (PRILOSEC) 40 MG capsule Take 20 mg by mouth daily.     [provider]  Simvastatin (ZOCOR PO) Take by mouth.    [provider]  simvastatin (ZOCOR) 20 MG tablet Take 20 mg by mouth at bedtime. 01/30/20   [provider]  vitamin E 600 UNIT capsule Take 600 Units by mouth daily.    [provider]    Allergies    Meperidine, Propoxyphene, Propoxyphene n-acetaminophen, and Hydrocodone  Review of Systems   Review of Systems  Unable to perform ROS: Dementia    Physical Exam Updated Vital Signs BP (!) 173/72 (BP Location: Right Arm)   Pulse 80   Temp 97.6 F (36.4 C) (Oral)   Resp 14   SpO2 99%   Physical Exam Vitals and nursing note reviewed.  Constitutional:      General: She is not in acute distress.    Appearance: Normal appearance. She is well-developed. She is not ill-appearing.  HENT:     Head: Normocephalic and atraumatic.     Right Ear: External ear normal.     Left Ear: External ear normal.     Nose: Nose normal. No congestion or rhinorrhea.     Mouth/Throat:     Mouth: Mucous membranes are moist.     Pharynx: Oropharynx is clear.  Eyes:     Extraocular Movements: Extraocular movements intact.     Pupils: Pupils are equal, round, and reactive to light.  Cardiovascular:     Rate and Rhythm: Normal rate and regular rhythm.     Pulses: Normal pulses.     Heart sounds: Normal heart sounds.  Pulmonary:     Effort: Pulmonary effort is normal. No respiratory distress.     Breath sounds: Normal breath sounds. No stridor. No wheezing, rhonchi or rales.  Abdominal:     General: There is no distension.     Palpations: Abdomen is soft.     Tenderness: There is no abdominal tenderness.  Musculoskeletal:        General: Normal range of motion.     Cervical back: Normal range of motion and neck supple.     Right lower leg: No edema.     Left lower leg: No edema.  Skin:    General: Skin is warm and dry.     Capillary Refill: Capillary refill takes less than 2 seconds.  Neurological:     General: No focal deficit present.     Mental Status: She is alert. Mental  status is at baseline. She is disoriented.     Cranial Nerves: No cranial nerve deficit.     Sensory:  No sensory deficit.     Motor: No weakness.     ED Results / Procedures / Treatments   Labs (all labs ordered are listed, but only abnormal results are displayed) Labs Reviewed  COMPREHENSIVE METABOLIC PANEL - Abnormal; Notable for the following components:      Result Value   Glucose, Bld 101 (*)    AST 64 (*)    All other components within normal limits  URINALYSIS, ROUTINE W REFLEX MICROSCOPIC - Abnormal; Notable for the following components:   Ketones, ur 20 (*)    All other components within normal limits  CBC  TSH  MAGNESIUM  CBG MONITORING, ED    EKG EKG Interpretation  Date/Time:  Thursday February 11 2020 16:21:23 EDT Ventricular Rate:  79 PR Interval:    QRS Duration: 91 QT Interval:  465 QTC Calculation: 534 R Axis:   76 Text Interpretation: Sinus rhythm Consider right atrial enlargement Anteroseptal infarct, age indeterminate Prolonged QT interval When compared to prior, slightly longer QTC. No STEMI Confirmed by Theda Belfast ([id number]) on 02/11/2020 4:25:58 PM   Radiology CT Head Wo Contrast  Result Date: 02/11/2020 CLINICAL DATA:  Altered mental status EXAM: CT HEAD WITHOUT CONTRAST TECHNIQUE: Contiguous axial images were obtained from the base of the skull through the vertex without intravenous contrast. COMPARISON:  MR brain dated 08/26/2012 FINDINGS: Brain: No evidence of acute infarction, hemorrhage, hydrocephalus, extra-axial collection or mass lesion/mass effect. There is moderate cerebral volume loss with associated ex vacuo dilatation. Vascular: There are vascular calcifications in the carotid siphons. Skull: Normal. Negative for fracture or focal lesion. Sinuses/Orbits: No acute finding. Other: There is a small scalp hematoma versus 7 mm subcutaneous mass overlying the right parietal bone (series 2, image 21). This was not definitely identified on prior exam. IMPRESSION: 1. No acute intracranial process. 2. Small scalp hematoma versus 7 mm subcutaneous mass  overlying the right parietal bone. Recommend correlation with physical exam. Electronically Signed   By: Romona Curls M.D.   On: 02/11/2020 18:16    Procedures Procedures (including critical care time)  Medications Ordered in ED Medications - No data to display  ED Course  I have reviewed the triage vital signs and the nursing notes.  Pertinent labs & imaging results that were available during my care of the patient were reviewed by me and considered in my medical decision making (see chart for details).    MDM Rules/Calculators/A&P                     Patient is an 84 year old female with a PMH of hyperlipidemia, hypertension and Alzheimer's disease presenting to the ED today with her husband due to altered mental status.  Patient disoriented but exam is otherwise unremarkable.  Vital signs stable.  Afebrile.  On arrival, patient appears generally well and is displaying no signs of acute distress.  She is alert but is only oriented to self.  She is not able to tell [place] when her birthday is and denies that she has Alzheimer's.  Patient's husband provides majority of history.  His biggest concern is that she has become increasingly more forgetful over the past week and a half.  It takes her over an hour to get up the staircase and she is refusing to take her medications.  He denies any recent falls or injuries.  Patient  says that he recognizes that he may need help in caring for her but is unsure of his options or how to take the next steps.  We will collect basic lab work for evaluation for patient's altered mental status.  EKG with sinus rhythm and no signs of acute ischemia.  QTC is 534 and prolonged in comparison with previous.  CBC and CMP unremarkable.  TSH and magnesium within normal limits.  Urinalysis shows no signs of infection.  CT head shows no acute intracranial process.  Following work-up, patient's decline in mental status does not appear to be consistent with electrolyte  abnormality, infection or intracranial injury.  Gradual progression of her Alzheimer's dementia is likely the cause.  Patient medication noncompliance over the past 2 weeks may also be exacerbating her symptoms.  Patient does not meet criteria for admission at this time.  Case manager and social work have spoken with patient and her husband.  They have arranged for a social worker to check in on them tomorrow.  They will begin arrangements for home health to help care for patient.  Husband is in agreement with plan.  Husband drives regularly and feels comfortable driving her home at this time.  Patient appears stable for discharge at this time.  Provided strict return precautions.  Encourage patient to follow-up with her PCP in 2 to 3 days for reassessment.  Patient assessed and evaluated with Dr. Rush Landmark.  Delray Alt, MD   Final Clinical Impression(s) / ED Diagnoses Final diagnoses:  Altered mental status, unspecified altered mental status type    Rx / DC Orders ED Discharge Orders    None       Delray Alt, MD 02/12/20 0100    Tegeler, Canary Brim, MD 02/12/20 1006

## 2020-02-11 NOTE — Social Work (Signed)
EDCSW met with Pt and spouse at bedside. Pt displays mild confusion with spouse filling in history.  Pt has recently been having increased difficulty in rising and walking. CSW spoke with Pt and spouse and HH was decided as the best course of action. A 3in 1 was also supplied to Pt.

## 2020-05-06 ENCOUNTER — Telehealth: Payer: Self-pay | Admitting: *Deleted

## 2020-05-06 NOTE — Telephone Encounter (Signed)
Patient son Casimiro Needle left message in triage voicemail asking for return call to see if her mother who Alzheimer's disease pessary was every removed at this office. I called the number he left [phone] to relay no record of pessary removal here, however the number recording says " [employer]" I could misunderstood the number in voicemail.

## 2020-07-20 ENCOUNTER — Other Ambulatory Visit: Payer: Self-pay

## 2020-07-20 ENCOUNTER — Emergency Department (HOSPITAL_COMMUNITY)
Admission: EM | Admit: 2020-07-20 | Discharge: 2020-07-20 | Disposition: A | Discharge status: Home / Self Care | Payer: Medicare Other | Attending: Emergency Medicine | Admitting: Emergency Medicine

## 2020-07-20 ENCOUNTER — Emergency Department (HOSPITAL_COMMUNITY): Payer: Medicare Other

## 2020-07-20 ENCOUNTER — Encounter (HOSPITAL_COMMUNITY): Payer: Self-pay | Admitting: Emergency Medicine

## 2020-07-20 DIAGNOSIS — Y999 Unspecified external cause status: Secondary | ICD-10-CM | POA: Insufficient documentation

## 2020-07-20 DIAGNOSIS — I10 Essential (primary) hypertension: Secondary | ICD-10-CM | POA: Insufficient documentation

## 2020-07-20 DIAGNOSIS — Y929 Unspecified place or not applicable: Secondary | ICD-10-CM | POA: Insufficient documentation

## 2020-07-20 DIAGNOSIS — Z79899 Other long term (current) drug therapy: Secondary | ICD-10-CM | POA: Insufficient documentation

## 2020-07-20 DIAGNOSIS — W19XXXA Unspecified fall, initial encounter: Secondary | ICD-10-CM | POA: Diagnosis not present

## 2020-07-20 DIAGNOSIS — Y939 Activity, unspecified: Secondary | ICD-10-CM | POA: Diagnosis not present

## 2020-07-20 DIAGNOSIS — F0390 Unspecified dementia without behavioral disturbance: Secondary | ICD-10-CM | POA: Diagnosis not present

## 2020-07-20 NOTE — ED Provider Notes (Signed)
[facility] EMERGENCY DEPARTMENT Provider Note   CSN: [id number] Arrival date & time: 07/20/20  0046     History Chief Complaint  Patient presents with  . Fall    Paula [name] is a 84 y.o. female.  The history is provided by the EMS personnel. The history is limited by the condition of the patient (dementia level 5 caveat ).  Fall This is a new problem. The current episode started less than 1 hour ago. The problem occurs constantly. The problem has not changed since onset.Pertinent negatives include no chest pain, no abdominal pain, no headaches and no shortness of breath. Nothing aggravates the symptoms. Nothing relieves the symptoms. She has tried nothing for the symptoms. The treatment provided no relief.  Fell at home. Patient has no complaints at this time.       Past Medical History:  Diagnosis Date  . Alzheimer disease (HCC)   . Diverticulosis   . Elevated cholesterol   . Hypertension   . Osteoarthritis     Patient Active Problem List   Diagnosis Date Noted  . Osteopenia 04/01/2015  . Cystocele, grade 2 04/01/2015  . Nocturia 03/24/2013  . Vaginal atrophy 03/24/2013  . Memory changes 11/26/2012  . Osteoarthritis   . Elevated cholesterol   . Hypertension     Past Surgical History:  Procedure Laterality Date  . BREAST BIOPSY     benign  . CARPAL TUNNEL RELEASE  2011  . REMOVAL OF VOCAL CORD POLYP    . REPLACEMENT TOTAL KNEE  2011   LEFT  . ROTATOR CUFF REPAIR       OB History    Gravida  2   Para  2   Term      Preterm      AB  0   Living  2     SAB      TAB      Ectopic      Multiple      Live Births              Family History  Problem Relation Age of Onset  . Hypertension Mother   . Heart disease Mother   . Colon cancer Mother 76  . Hypertension Father   . Heart disease Father   . Diabetes Sister   . Hypertension Sister   . Diabetes Sister   . Hypertension Sister   . Breast cancer Paternal Aunt         Age 47's    Social History   Tobacco Use  . Smoking status: Never Smoker  . Smokeless tobacco: Never Used  Substance Use Topics  . Alcohol use: No    Alcohol/week: 0.0 standard drinks  . Drug use: No    Home Medications Prior to Admission medications   Medication Sig Start Date End Date Taking? Authorizing Provider  benazepril (LOTENSIN) 5 MG tablet Take 5 mg by mouth daily. 01/30/20   [provider]  Calcium Carbonate-Vitamin D (CALCIUM + D PO) Take 1 tablet by mouth daily.     [provider]  Cholecalciferol (VITAMIN D3) 125 MCG (5000 UT) CAPS Take 1 capsule by mouth daily.    [provider]  magnesium 30 MG tablet Take 30 mg by mouth daily.     [provider]  memantine (NAMENDA XR) 28 MG CP24 24 hr capsule Take 28 mg by mouth daily.     [provider]  Omega-3 Fatty Acids (FISH OIL)  1200 MG CAPS Take 1 capsule by mouth daily.     [provider]  omeprazole (PRILOSEC) 20 MG capsule Take 20 mg by mouth daily.    [provider]  simvastatin (ZOCOR) 20 MG tablet Take 20 mg by mouth at bedtime. 01/30/20   [provider]  vitamin E 600 UNIT capsule Take 600 Units by mouth daily.    [provider]    Allergies    Meperidine, Propoxyphene, Propoxyphene n-acetaminophen, and Hydrocodone  Review of Systems   Review of Systems  Unable to perform ROS: Dementia  Respiratory: Negative for shortness of breath.   Cardiovascular: Negative for chest pain.  Gastrointestinal: Negative for abdominal pain.  Neurological: Negative for headaches.    Physical Exam Updated Vital Signs BP 117/64 (BP Location: Left Arm)   Pulse (!) 112   Temp 98.4 F (36.9 C) (Oral)   Resp 16   Ht 5\' 3"  (1.6 m)   Wt 67.6 kg   SpO2 98%   BMI 26.40 kg/m   Physical Exam Vitals and nursing note reviewed.  Constitutional:      General: She is not in acute distress.    Appearance: Normal appearance.  HENT:      Head: Normocephalic and atraumatic.     Nose: Nose normal.     Mouth/Throat:     Mouth: Mucous membranes are moist.  Eyes:     Conjunctiva/sclera: Conjunctivae normal.     Pupils: Pupils are equal, round, and reactive to light.  Cardiovascular:     Rate and Rhythm: Normal rate and regular rhythm.     Pulses: Normal pulses.     Heart sounds: Normal heart sounds.  Pulmonary:     Effort: Pulmonary effort is normal.     Breath sounds: Normal breath sounds.  Abdominal:     General: Abdomen is flat. Bowel sounds are normal.     Palpations: Abdomen is soft.     Tenderness: There is no abdominal tenderness. There is no guarding.  Musculoskeletal:        General: No tenderness or deformity. Normal range of motion.     Right wrist: Normal.     Left wrist: Normal.     Right hand: Normal.     Left hand: Normal.     Cervical back: Normal, normal range of motion and neck supple.     Right hip: Normal.     Left hip: Normal.     Right knee: Normal.     Left knee: Normal.     Right ankle: Normal.     Right Achilles Tendon: Normal.     Left ankle: Normal.     Left Achilles Tendon: Normal.     Right foot: Normal.     Left foot: Normal.  Skin:    General: Skin is warm and dry.     Capillary Refill: Capillary refill takes less than 2 seconds.  Neurological:     General: No focal deficit present.     Mental Status: She is alert.     Deep Tendon Reflexes: Reflexes normal.  Psychiatric:        Mood and Affect: Mood normal.     ED Results / Procedures / Treatments   Labs (all labs ordered are listed, but only abnormal results are displayed) Labs Reviewed - No data to display  EKG None  Radiology Results for orders placed or performed during the hospital encounter of 02/11/20  CBC  Result Value Ref Range  WBC 7.4 4.0 - 10.5 K/uL   RBC 4.18 3.87 - 5.11 MIL/uL   Hemoglobin 12.7 12.0 - 15.0 g/dL   HCT 16.138.9 36 - 46 %   MCV 93.1 80.0 - 100.0 fL   MCH 30.4 26.0 - 34.0 pg   MCHC 32.6  30.0 - 36.0 g/dL   RDW 09.613.7 04.511.5 - 40.915.5 %   Platelets 224 150 - 400 K/uL   nRBC 0.0 0.0 - 0.2 %  Comprehensive metabolic panel  Result Value Ref Range   Sodium 140 135 - 145 mmol/L   Potassium 4.1 3.5 - 5.1 mmol/L   Chloride 102 98 - 111 mmol/L   CO2 28 22 - 32 mmol/L   Glucose, Bld 101 (H) 70 - 99 mg/dL   BUN 14 8 - 23 mg/dL   Creatinine, Ser 8.110.80 0.44 - 1.00 mg/dL   Calcium 9.4 8.9 - 91.410.3 mg/dL   Total Protein 6.6 6.5 - 8.1 g/dL   Albumin 3.8 3.5 - 5.0 g/dL   AST 64 (H) 15 - 41 U/L   ALT 23 0 - 44 U/L   Alkaline Phosphatase 63 38 - 126 U/L   Total Bilirubin 0.8 0.3 - 1.2 mg/dL   GFR calc non Af Amer >60 >60 mL/min   GFR calc Af Amer >60 >60 mL/min   Anion gap 10 5 - 15  TSH  Result Value Ref Range   TSH 2.089 0.350 - 4.500 uIU/mL  Magnesium  Result Value Ref Range   Magnesium 2.1 1.7 - 2.4 mg/dL  Urinalysis, Routine w reflex microscopic  Result Value Ref Range   Color, Urine YELLOW YELLOW   APPearance CLEAR CLEAR   Specific Gravity, Urine 1.017 1.005 - 1.030   pH 5.0 5.0 - 8.0   Glucose, UA NEGATIVE NEGATIVE mg/dL   Hgb urine dipstick NEGATIVE NEGATIVE   Bilirubin Urine NEGATIVE NEGATIVE   Ketones, ur 20 (A) NEGATIVE mg/dL   Protein, ur NEGATIVE NEGATIVE mg/dL   Nitrite NEGATIVE NEGATIVE   Leukocytes,Ua NEGATIVE NEGATIVE  CBG monitoring, ED  Result Value Ref Range   Glucose-Capillary 91 70 - 99 mg/dL   CT Head Wo Contrast  Result Date: 07/20/2020 CLINICAL DATA:  Fall down stairs EXAM: CT HEAD WITHOUT CONTRAST TECHNIQUE: Contiguous axial images were obtained from the base of the skull through the vertex without intravenous contrast. COMPARISON:  02/11/2020 FINDINGS: Brain: There is atrophy and chronic small vessel disease changes. No acute intracranial abnormality. Specifically, no hemorrhage, hydrocephalus, mass lesion, acute infarction, or significant intracranial injury. Vascular: No hyperdense vessel or unexpected calcification. Skull: No acute calvarial  abnormality. Sinuses/Orbits: Visualized paranasal sinuses and mastoids clear. Orbital soft tissues unremarkable. Other: None IMPRESSION: Atrophy, chronic microvascular disease. No acute intracranial abnormality. Electronically Signed   By: Charlett NoseKevin  Dover M.D.   On: 07/20/2020 01:35   CT Cervical Spine Wo Contrast  Result Date: 07/20/2020 CLINICAL DATA:  Fall down stairs EXAM: CT CERVICAL SPINE WITHOUT CONTRAST TECHNIQUE: Multidetector CT imaging of the cervical spine was performed without intravenous contrast. Multiplanar CT image reconstructions were also generated. COMPARISON:  None. FINDINGS: Alignment: No subluxation Skull base and vertebrae: No acute fracture. No primary bone lesion or focal pathologic process. Soft tissues and spinal canal: No prevertebral fluid or swelling. No visible canal hematoma. Disc levels: Diffuse degenerative disc disease with disc space narrowing and spurring. Diffuse bilateral degenerative facet disease. Upper chest: No acute findings Other: None IMPRESSION: Diffuse degenerative disc and facet disease. No acute bony abnormality. Electronically Signed  By: Charlett Nose M.D.   On: 07/20/2020 01:37    Procedures Procedures (including critical care time)  Medications Ordered in ED Medications - No data to display  ED Course  I have reviewed the triage vital signs and the nursing notes.  Pertinent labs & imaging results that were available during my care of the patient were reviewed by me and considered in my medical decision making (see chart for details).   Well appearing, no complaints at this time.  Moving all four extremities well without pain.  Stable for discharge with close follow up.    Paula [name] was evaluated in Emergency Department on 07/20/2020 for the symptoms described in the history of present illness. She was evaluated in the context of the global COVID-19 pandemic, which necessitated consideration that the patient might be at risk for infection  with the SARS-CoV-2 virus that causes COVID-19. Institutional protocols and algorithms that pertain to the evaluation of patients at risk for COVID-19 are in a state of rapid change based on information released by regulatory bodies including the CDC and federal and state organizations. These policies and algorithms were followed during the patient's care in the ED.   Final Clinical Impression(s) / ED Diagnoses  Return for intractable cough, coughing up blood,fevers >100.4 unrelieved by medication, shortness of breath, intractable vomiting, chest pain, shortness of breath, weakness,numbness, changes in speech, facial asymmetry,abdominal pain, passing out,Inability to tolerate liquids or food, cough, altered mental status or any concerns. No signs of systemic illness or infection. The patient is nontoxic-appearing on exam and vital signs are within normal limits.   I have reviewed the triage vital signs and the nursing notes. Pertinent labs &imaging results that were available during my care of the patient were reviewed by me and considered in my medical decision making (see chart for details).After history, exam, and medical workup I feel the patient has beenappropriately medically screened and is safe for discharge home. Pertinent diagnoses were discussed with the patient. Patient was given return precautions.    Dheeraj Hail, MD 07/20/20 7017

## 2020-07-20 NOTE — ED Notes (Signed)
Patient verbalizes understanding of discharge instructions. Opportunity for questioning and answers were provided. Armband removed by staff, pt discharged from ED via wheelchair to return home with husband.

## 2020-07-20 NOTE — ED Triage Notes (Signed)
Per [facility], pt from home w/ spouse, tonight she fell down 8 carpet stairs onto a carpet floor.  This is the 2nd time this has happened in the past month, no LOC or blood thinners.  Pain in the mid thoracic region when she is in a sitting position, c-collar placed.    101 temp w/ EMS 120 HR 127/83 20G L AC

## 2020-08-05 ENCOUNTER — Other Ambulatory Visit (HOSPITAL_COMMUNITY): Payer: Self-pay | Admitting: *Deleted

## 2020-08-08 ENCOUNTER — Inpatient Hospital Stay (HOSPITAL_COMMUNITY): Admission: RE | Admit: 2020-08-08 | Payer: Medicare Other | Source: Ambulatory Visit

## 2020-08-08 ENCOUNTER — Encounter (HOSPITAL_COMMUNITY): Payer: Self-pay

## 2020-08-27 ENCOUNTER — Other Ambulatory Visit: Payer: Self-pay

## 2020-08-27 ENCOUNTER — Emergency Department (HOSPITAL_COMMUNITY)
Admission: EM | Admit: 2020-08-27 | Discharge: 2020-08-27 | Disposition: A | Discharge status: Home / Self Care | Payer: Medicare Other | Attending: Emergency Medicine | Admitting: Emergency Medicine

## 2020-08-27 ENCOUNTER — Emergency Department (HOSPITAL_COMMUNITY): Payer: Medicare Other

## 2020-08-27 DIAGNOSIS — G309 Alzheimer's disease, unspecified: Secondary | ICD-10-CM | POA: Diagnosis not present

## 2020-08-27 DIAGNOSIS — Z96652 Presence of left artificial knee joint: Secondary | ICD-10-CM | POA: Diagnosis not present

## 2020-08-27 DIAGNOSIS — Y92129 Unspecified place in nursing home as the place of occurrence of the external cause: Secondary | ICD-10-CM | POA: Diagnosis not present

## 2020-08-27 DIAGNOSIS — Z79899 Other long term (current) drug therapy: Secondary | ICD-10-CM | POA: Insufficient documentation

## 2020-08-27 DIAGNOSIS — S0990XA Unspecified injury of head, initial encounter: Secondary | ICD-10-CM | POA: Diagnosis not present

## 2020-08-27 DIAGNOSIS — R296 Repeated falls: Secondary | ICD-10-CM

## 2020-08-27 DIAGNOSIS — I10 Essential (primary) hypertension: Secondary | ICD-10-CM | POA: Insufficient documentation

## 2020-08-27 DIAGNOSIS — W19XXXA Unspecified fall, initial encounter: Secondary | ICD-10-CM | POA: Diagnosis not present

## 2020-08-27 NOTE — ED Provider Notes (Signed)
[facility]-EMERGENCY DEPT Provider Note   CSN: [id number] Arrival date & time: 08/27/20  0601     History Chief Complaint  Patient presents with  . Fall  . Head Injury    Paula [name] is a 84 y.o. female with PMH of Alzheimer's disease who presents the ED via EMS in C-collar from [facility] after sustaining unwitnessed fall.  On my examination, patient appears to have advanced dementia and is actively attempting to rip off her c-collar.  She has just returned from CT and the images are pending.  She does not recall her fall and is only oriented to herself.  Her exam is consistent with advanced dementia.  We will call [facility] and attempt to obtain further information.  Patient denies any pain symptoms.  Her only complaint is her c-collar.  No chest pain or shortness of breath.  Denies any abdominal pain.  Moving all extremities.  Follows directions.  Level 5 caveat due to dementia.   HPI     Past Medical History:  Diagnosis Date  . Alzheimer disease (HCC)   . Diverticulosis   . Elevated cholesterol   . Hypertension   . Osteoarthritis     Patient Active Problem List   Diagnosis Date Noted  . Osteopenia 04/01/2015  . Cystocele, grade 2 04/01/2015  . Nocturia 03/24/2013  . Vaginal atrophy 03/24/2013  . Memory changes 11/26/2012  . Osteoarthritis   . Elevated cholesterol   . Hypertension     Past Surgical History:  Procedure Laterality Date  . BREAST BIOPSY     benign  . CARPAL TUNNEL RELEASE  2011  . REMOVAL OF VOCAL CORD POLYP    . REPLACEMENT TOTAL KNEE  2011   LEFT  . ROTATOR CUFF REPAIR       OB History    Gravida  2   Para  2   Term      Preterm      AB  0   Living  2     SAB      TAB      Ectopic      Multiple      Live Births              Family History  Problem Relation Age of Onset  . Hypertension Mother   . Heart disease Mother   . Colon cancer Mother 42  . Hypertension  Father   . Heart disease Father   . Diabetes Sister   . Hypertension Sister   . Diabetes Sister   . Hypertension Sister   . Breast cancer Paternal Aunt        Age 61's    Social History   Tobacco Use  . Smoking status: Never Smoker  . Smokeless tobacco: Never Used  Substance Use Topics  . Alcohol use: No    Alcohol/week: 0.0 standard drinks  . Drug use: No    Home Medications Prior to Admission medications   Medication Sig Start Date End Date Taking? Authorizing Provider  benazepril (LOTENSIN) 5 MG tablet Take 5 mg by mouth daily. 01/30/20   [provider]  Calcium Carbonate-Vitamin D (CALCIUM + D PO) Take 1 tablet by mouth daily.     [provider]  Cholecalciferol (VITAMIN D3) 125 MCG (5000 UT) CAPS Take 1 capsule by mouth daily.    [provider]  magnesium 30 MG tablet Take 30 mg by mouth daily.  [provider]  memantine (NAMENDA XR) 28 MG CP24 24 hr capsule Take 28 mg by mouth daily.     [provider]  Omega-3 Fatty Acids (FISH OIL) 1200 MG CAPS Take 1 capsule by mouth daily.     [provider]  omeprazole (PRILOSEC) 20 MG capsule Take 20 mg by mouth daily.    [provider]  simvastatin (ZOCOR) 20 MG tablet Take 20 mg by mouth at bedtime. 01/30/20   [provider]  vitamin E 600 UNIT capsule Take 600 Units by mouth daily.    [provider]    Allergies    Meperidine, Propoxyphene, Propoxyphene n-acetaminophen, and Hydrocodone  Review of Systems   Review of Systems  All other systems reviewed and are negative.   Physical Exam Updated Vital Signs BP (!) 143/91   Pulse 65   Temp 97.7 F (36.5 C) (Oral)   Resp 17   SpO2 99%   Physical Exam Vitals and nursing note reviewed. Exam conducted with a chaperone present.  Constitutional:      General: She is not in acute distress.    Appearance: Normal appearance. She is not ill-appearing.  HENT:     Head: Normocephalic.      Comments: Very mild erythema over center of forehead, suspicious for mild abrasion.  No ecchymosis.  No palpable skull defects.  No other injuries. Eyes:     General: No scleral icterus.    Extraocular Movements: Extraocular movements intact.     Conjunctiva/sclera: Conjunctivae normal.     Pupils: Pupils are equal, round, and reactive to light.  Neck:     Comments: No midline cervical tenderness to palpation. Cardiovascular:     Rate and Rhythm: Normal rate and regular rhythm.     Pulses: Normal pulses.     Heart sounds: Normal heart sounds.  Pulmonary:     Effort: Pulmonary effort is normal. No respiratory distress.     Breath sounds: Normal breath sounds. No wheezing.     Comments: Breath sounds intact bilaterally.  Symmetric chest rise. Abdominal:     General: Abdomen is flat. There is no distension.     Palpations: Abdomen is soft.     Tenderness: There is no abdominal tenderness.  Musculoskeletal:        General: Normal range of motion.     Cervical back: Normal range of motion and neck supple. No rigidity.     Right lower leg: No edema.     Left lower leg: No edema.  Skin:    General: Skin is dry.     Capillary Refill: Capillary refill takes less than 2 seconds.  Neurological:     Mental Status: She is alert and oriented to person, place, and time.     GCS: GCS eye subscore is 4. GCS verbal subscore is 5. GCS motor subscore is 6.  Psychiatric:        Mood and Affect: Mood normal.        Behavior: Behavior normal.        Thought Content: Thought content normal.     ED Results / Procedures / Treatments   Labs (all labs ordered are listed, but only abnormal results are displayed) Labs Reviewed - No data to display  EKG None  Radiology CT Head Wo Contrast  Result Date: 08/27/2020 CLINICAL DATA:  Head trauma, fall. EXAM: CT HEAD WITHOUT CONTRAST CT CERVICAL SPINE WITHOUT CONTRAST TECHNIQUE: Multidetector CT imaging of the head and cervical spine  was performed  following the standard protocol without intravenous contrast. Multiplanar CT image reconstructions of the cervical spine were also generated. COMPARISON:  07/20/2020 head and cervical spine CT. FINDINGS: Motion artifact limits evaluation. CT HEAD FINDINGS Brain: No large territorial infarct. No intracranial hemorrhage. Diffuse cerebral atrophy with ex vacuo dilatation. Mild chronic microvascular ischemic changes. No midline shift, mass lesion, ventriculomegaly or extra-axial fluid collection. Vascular: No hyperdense vessel or unexpected calcification. Bilateral carotid siphon atherosclerotic calcifications. Skull: Negative for fracture or focal lesion. Sinuses/Orbits: Normal orbits. Clear paranasal sinuses. No mastoid effusion. Other: Frontal scalp soft tissue swelling. CT CERVICAL SPINE FINDINGS Alignment: Normal. Skull base and vertebrae: No acute fracture. No primary bone lesion or focal pathologic process. Soft tissues and spinal canal: No prevertebral fluid or swelling. No visible canal hematoma. Disc levels: Multilevel osteophytosis, endplate sclerosis and disc space loss. Partial fusion at the C6-7 level with bridging ossification of the anterior and posterior longitudinal ligaments. Upper chest: Clear lung apices. Other: None. IMPRESSION: Motion degraded examination. No acute intracranial process or fracture. Frontal scalp soft tissue swelling. No cervical spine fracture or traumatic listhesis. Multilevel spondylosis. Electronically Signed   By: Stana Bunting M.D.   On: 08/27/2020 07:10   CT Cervical Spine Wo Contrast  Result Date: 08/27/2020 CLINICAL DATA:  Head trauma, fall. EXAM: CT HEAD WITHOUT CONTRAST CT CERVICAL SPINE WITHOUT CONTRAST TECHNIQUE: Multidetector CT imaging of the head and cervical spine was performed following the standard protocol without intravenous contrast. Multiplanar CT image reconstructions of the cervical spine were also generated. COMPARISON:  07/20/2020 head and  cervical spine CT. FINDINGS: Motion artifact limits evaluation. CT HEAD FINDINGS Brain: No large territorial infarct. No intracranial hemorrhage. Diffuse cerebral atrophy with ex vacuo dilatation. Mild chronic microvascular ischemic changes. No midline shift, mass lesion, ventriculomegaly or extra-axial fluid collection. Vascular: No hyperdense vessel or unexpected calcification. Bilateral carotid siphon atherosclerotic calcifications. Skull: Negative for fracture or focal lesion. Sinuses/Orbits: Normal orbits. Clear paranasal sinuses. No mastoid effusion. Other: Frontal scalp soft tissue swelling. CT CERVICAL SPINE FINDINGS Alignment: Normal. Skull base and vertebrae: No acute fracture. No primary bone lesion or focal pathologic process. Soft tissues and spinal canal: No prevertebral fluid or swelling. No visible canal hematoma. Disc levels: Multilevel osteophytosis, endplate sclerosis and disc space loss. Partial fusion at the C6-7 level with bridging ossification of the anterior and posterior longitudinal ligaments. Upper chest: Clear lung apices. Other: None. IMPRESSION: Motion degraded examination. No acute intracranial process or fracture. Frontal scalp soft tissue swelling. No cervical spine fracture or traumatic listhesis. Multilevel spondylosis. Electronically Signed   By: Stana Bunting M.D.   On: 08/27/2020 07:10    Procedures Procedures (including critical care time)  Medications Ordered in ED Medications - No data to display  ED Course  I have reviewed the triage vital signs and the nursing notes.  Pertinent labs & imaging results that were available during my care of the patient were reviewed by me and considered in my medical decision making (see chart for details).    MDM Rules/Calculators/A&P                          Spoke with Deanna Artis from [facility] who states that she has been there for three weeks and had not sustained any falls.  Denies any fevers.  Eating and drinking well.   No deviation in her behavior.  Deanna Artis reports that, per the family, patient has had falls in the past.  She suspects  this was mechanical.   Given reassuring physical exam and CT imaging of head and C-spine, feel as though she is appropriate for discharge back to the care of [facility].  Do not feel as though laboratory work-up is warranted given reassuring vital signs and lack of any other symptoms/changes in behavior from her memory care unit.  Will write strict ED return precautions in discharge paperwork.  Spoke with first husband, Emmie NiemannDavid Petrich, who was at bedside.    Final Clinical Impression(s) / ED Diagnoses Final diagnoses:  Unwitnessed fall    Rx / DC Orders ED Discharge Orders    None       Lorelee NewGreen, Sumit Branham L, PA-C 08/27/20 0845    Gwyneth SproutPlunkett, Whitney, MD 08/27/20 1940

## 2020-08-27 NOTE — ED Notes (Signed)
Patient is attempting to remove c-collar, has pulled out styrofoam, attempting to redirect patient and inform of need for collar, patient not comprehending.

## 2020-08-27 NOTE — ED Notes (Signed)
Notified [facility] of need for Recruitment consultant.  Per [facility], one is not currently available but he will look to see if one may come available.

## 2020-08-27 NOTE — ED Triage Notes (Signed)
Patient in from [facility] after suffering an unwitnessed fall, arrives to ED in c-collar with abrasion to forehead, Alert to self only.

## 2020-08-27 NOTE — ED Notes (Signed)
[facility] called  

## 2020-08-27 NOTE — Discharge Instructions (Addendum)
Patient's CT obtained of head and cervical spine was unremarkable.  Patient is moving all extremities.   No laboratory work-up was obtained given lack of any fevers/deviation behavior reported by patient or [facility].  Please have her return to the ED or seek immediate medical attention should she develop any new or worsening symptoms.

## 2020-09-16 ENCOUNTER — Encounter (HOSPITAL_COMMUNITY): Payer: Self-pay

## 2020-09-16 ENCOUNTER — Other Ambulatory Visit: Payer: Self-pay

## 2020-09-16 ENCOUNTER — Ambulatory Visit (HOSPITAL_COMMUNITY)
Admission: EM | Admit: 2020-09-16 | Discharge: 2020-09-16 | Disposition: A | Discharge status: Home / Self Care | Payer: Medicare Other

## 2020-09-16 ENCOUNTER — Ambulatory Visit (HOSPITAL_COMMUNITY): Admit: 2020-09-16 | Disposition: A | Discharge status: Still In Hospital | Payer: Self-pay

## 2020-09-16 DIAGNOSIS — S52501A Unspecified fracture of the lower end of right radius, initial encounter for closed fracture: Secondary | ICD-10-CM

## 2020-09-16 MED ORDER — ACETAMINOPHEN 500 MG PO TABS: 500.0000 mg | ORAL_TABLET | Freq: Four times a day (QID) | ORAL | 0 refills | Status: DC | PRN

## 2020-09-16 MED ORDER — ACETAMINOPHEN 500 MG PO TABS: 500.0000 mg | ORAL_TABLET | Freq: Four times a day (QID) | ORAL | 0 refills | Status: AC | PRN

## 2020-09-16 NOTE — ED Triage Notes (Signed)
Per daughter, pt fell yesterday and injured the right arm, right wrist and right hand. Daughter reports pt was seen by EMS and had a x ray done. Impression in the xray brought by the pt daugher says: Acute mildly displaced impacted fracture, distal radial metaphysis.

## 2020-09-16 NOTE — ED Provider Notes (Signed)
[facility]    CSN: [id number] Arrival date & time: 09/16/20  1141      History   Chief Complaint Chief Complaint  Patient presents with   Arm Injury   Hand Injury    HPI Paula [name] is a 84 y.o. female.   Paula [name] presents with her daughter with complaints of injury to right wrist. She resides in a memory care unit at [facility] [place]. She fell. Unknown specifics about this fall. No specific head injury loc or altered mental status. History of falls. EMS came and assessed and patient's husband declined transport. She had xrays obtained to right wrist and hand, appears that these were done at her living facility, with radiologist read indicating a right distal radius fracture. She has swelling and abnormality to right wrist. She is right handed. No previous hand or wrist injury in the past.     ROS per HPI, negative if not otherwise mentioned.      Past Medical History:  Diagnosis Date   Alzheimer disease (HCC)    Diverticulosis    Elevated cholesterol    Hypertension    Osteoarthritis     Patient Active Problem List   Diagnosis Date Noted   Osteopenia 04/01/2015   Cystocele, grade 2 04/01/2015   Nocturia 03/24/2013   Vaginal atrophy 03/24/2013   Memory changes 11/26/2012   Osteoarthritis    Elevated cholesterol    Hypertension     Past Surgical History:  Procedure Laterality Date   BREAST BIOPSY     benign   CARPAL TUNNEL RELEASE  2011   REMOVAL OF VOCAL CORD POLYP     REPLACEMENT TOTAL KNEE  2011   LEFT   ROTATOR CUFF REPAIR      OB History    Gravida  2   Para  2   Term      Preterm      AB  0   Living  2     SAB      TAB      Ectopic      Multiple      Live Births               Home Medications    Prior to Admission medications   Medication Sig Start Date End Date Taking? Authorizing Provider  acetaminophen (TYLENOL) 500 MG tablet Take 1 tablet (500 mg total) by mouth  every 6 (six) hours as needed for mild pain or moderate pain. 09/16/20   Linus Mako B, NP  benazepril (LOTENSIN) 5 MG tablet Take 5 mg by mouth daily. 01/30/20   [provider]  Calcium Carbonate-Vitamin D (CALCIUM + D PO) Take 1 tablet by mouth daily.     [provider]  Cholecalciferol (VITAMIN D3) 125 MCG (5000 UT) CAPS Take 1 capsule by mouth daily.    [provider]  donepezil (ARICEPT) 10 MG tablet donepezil 10 mg tablet    [provider]  LORazepam (ATIVAN) 0.5 MG tablet Take 0.5 mg by mouth daily as needed. 09/14/20   [provider]  magnesium 30 MG tablet Take 30 mg by mouth daily.     [provider]  memantine (NAMENDA XR) 28 MG CP24 24 hr capsule Take 28 mg by mouth daily.     [provider]  Omega-3 Fatty Acids (FISH OIL) 1200 MG CAPS Take 1 capsule by mouth daily.     [provider]  omeprazole (PRILOSEC) 20  MG capsule Take 20 mg by mouth daily.    [provider]  simvastatin (ZOCOR) 20 MG tablet Take 20 mg by mouth at bedtime. 01/30/20   [provider]  vitamin E 600 UNIT capsule Take 600 Units by mouth daily.    [provider]    Family History Family History  Problem Relation Age of Onset   Hypertension Mother    Heart disease Mother    Colon cancer Mother 51   Hypertension Father    Heart disease Father    Diabetes Sister    Hypertension Sister    Diabetes Sister    Hypertension Sister    Breast cancer Paternal Aunt        Age 55's    Social History Social History   Tobacco Use   Smoking status: Never Smoker   Smokeless tobacco: Never Used  Substance Use Topics   Alcohol use: No    Alcohol/week: 0.0 standard drinks   Drug use: No     Allergies   Meperidine, Propoxyphene, Propoxyphene n-acetaminophen, and Hydrocodone   Review of Systems Review of Systems   Physical Exam Triage Vital Signs ED Triage Vitals  Enc Vitals Group      BP 09/16/20 1242 111/66     Pulse Rate 09/16/20 1242 66     Resp --      Temp 09/16/20 1242 97.7 F (36.5 C)     Temp src --      SpO2 09/16/20 1242 99 %     Weight --      Height --      Head Circumference --      Peak Flow --      Pain Score 09/16/20 1240 0     Pain Loc --      Pain Edu? --      Excl. in GC? --    No data found.  Updated Vital Signs BP 111/66 (BP Location: Left Arm)    Pulse 66    Temp 97.7 F (36.5 C)    SpO2 99%   Visual Acuity Right Eye Distance:   Left Eye Distance:   Bilateral Distance:    Right Eye Near:   Left Eye Near:    Bilateral Near:     Physical Exam Constitutional:      General: She is not in acute distress.    Appearance: She is well-developed.  Cardiovascular:     Rate and Rhythm: Normal rate.  Pulmonary:     Effort: Pulmonary effort is normal.  Musculoskeletal:     Right wrist: Swelling, deformity, tenderness and bony tenderness present. No effusion. Decreased range of motion.     Right hand: Normal.     Comments: Sensation intact; cap refill < 2 seconds  ; strong radial pulse   Skin:    General: Skin is warm and dry.  Neurological:     Mental Status: She is alert. Mental status is at baseline.      UC Treatments / Results  Labs (all labs ordered are listed, but only abnormal results are displayed) Labs Reviewed - No data to display  EKG   Radiology No results found.  Procedures Procedures (including critical care time)  Medications Ordered in UC Medications - No data to display  Initial Impression / Assessment and Plan / UC Course  I have reviewed the triage vital signs and the nursing notes.  Pertinent labs & imaging results that were available during my care of the  patient were reviewed by me and considered in my medical decision making (see chart for details).     Radiology report received from imaging obtained 11/4 s/p fall, collected by [facility] and read by [facility].  Impression: 1. Acute mildly displaced impacted fracture, distal radial metaphysis. 2. Ulnar styloid intact. 3. Mild osteopenia. 4. Mild osteoarthritis demonstrated.   Sugar tong placed. Orthopedic follow up for definitive management discussed and recommended. Patient daughter verbalized understanding and agreeable to plan.      Final Clinical Impressions(s) / UC Diagnoses   Final diagnoses:  Closed fracture of distal end of right radius, unspecified fracture morphology, initial encounter     Discharge Instructions     We are placing a splint today which is to be left on until seen by orthopedics.  Sling as needed.  Tylenol as needed for pain.  Call today to set up follow up appointment with orthopedics, hand surgeon, for this distal radial fracture.    ED Prescriptions    Medication Sig Dispense Auth. Provider   acetaminophen (TYLENOL) 500 MG tablet Take 1 tablet (500 mg total) by mouth every 6 (six) hours as needed for mild pain or moderate pain. 30 tablet Georgetta Haber, NP     PDMP not reviewed this encounter.   Georgetta Haber, NP 09/16/20 1309

## 2020-09-16 NOTE — Discharge Instructions (Addendum)
We are placing a splint today which is to be left on until seen by orthopedics.  Sling as needed.  Tylenol as needed for pain.  Call today to set up follow up appointment with orthopedics, hand surgeon, for this distal radial fracture.

## 2021-02-09 IMAGING — MG DIGITAL SCREENING BILAT W/ TOMO W/ CAD
8 series · 8 of 24 positions shown · non-contrast
Comparison: Previous exam(s).

CLINICAL DATA: Screening.

EXAM:
DIGITAL SCREENING BILATERAL MAMMOGRAM WITH TOMO AND CAD

[R MLO synth-2D]
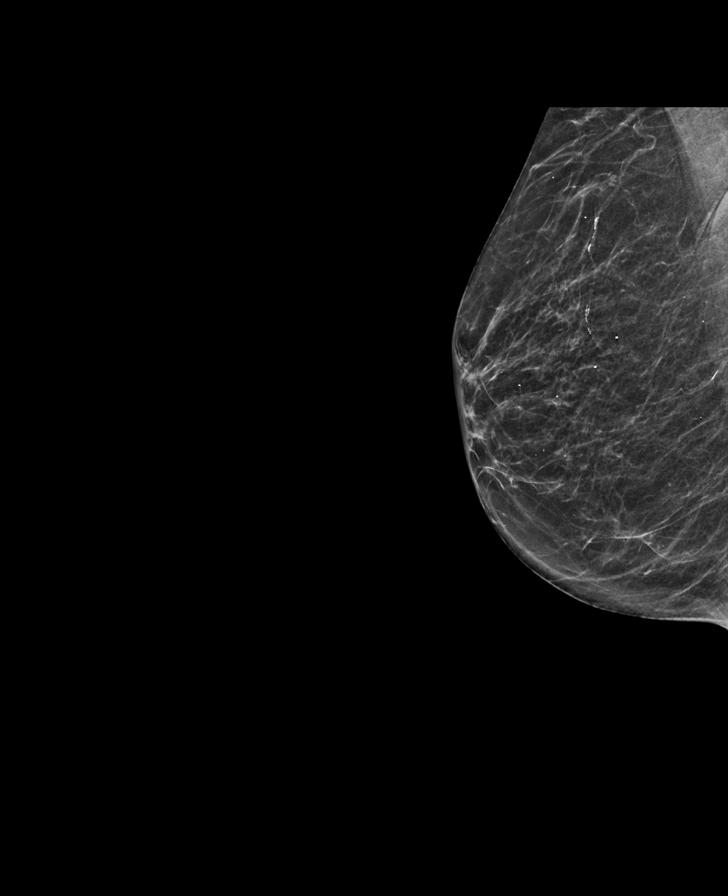

[L MLO synth-2D]
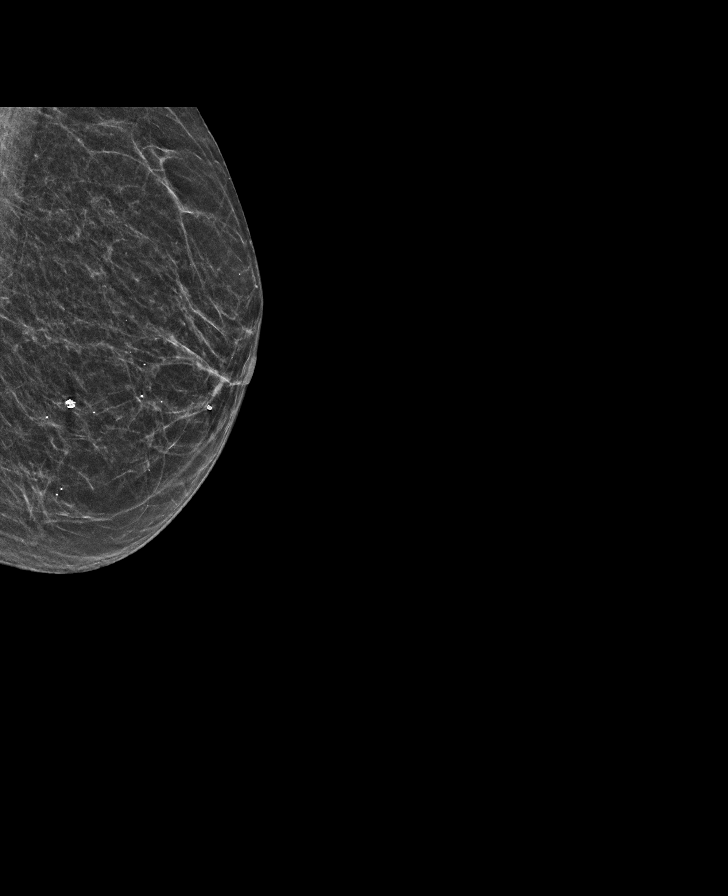

[L CC synth-2D]
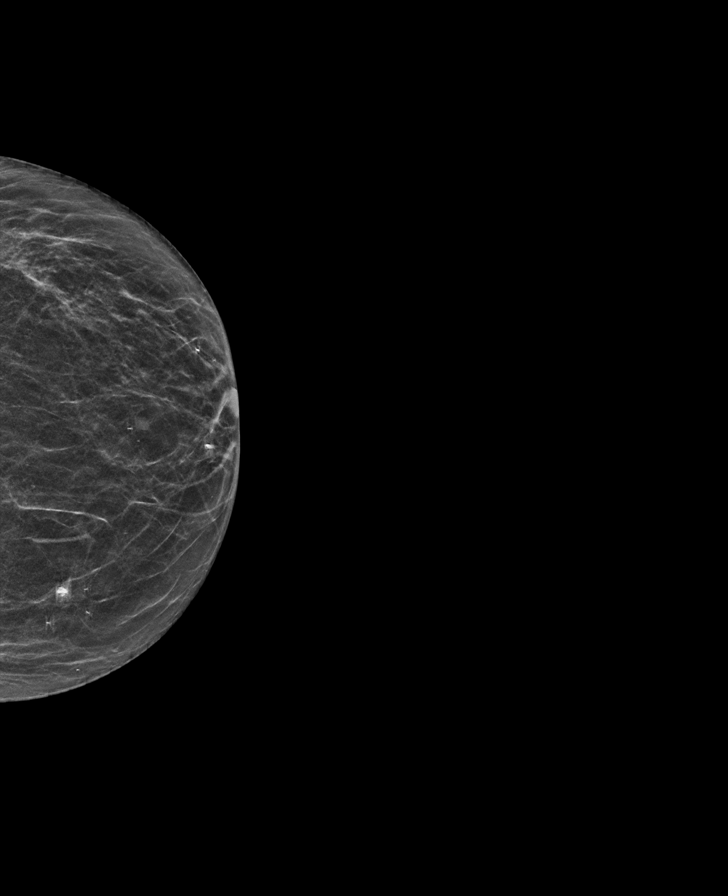

[R CC synth-2D]
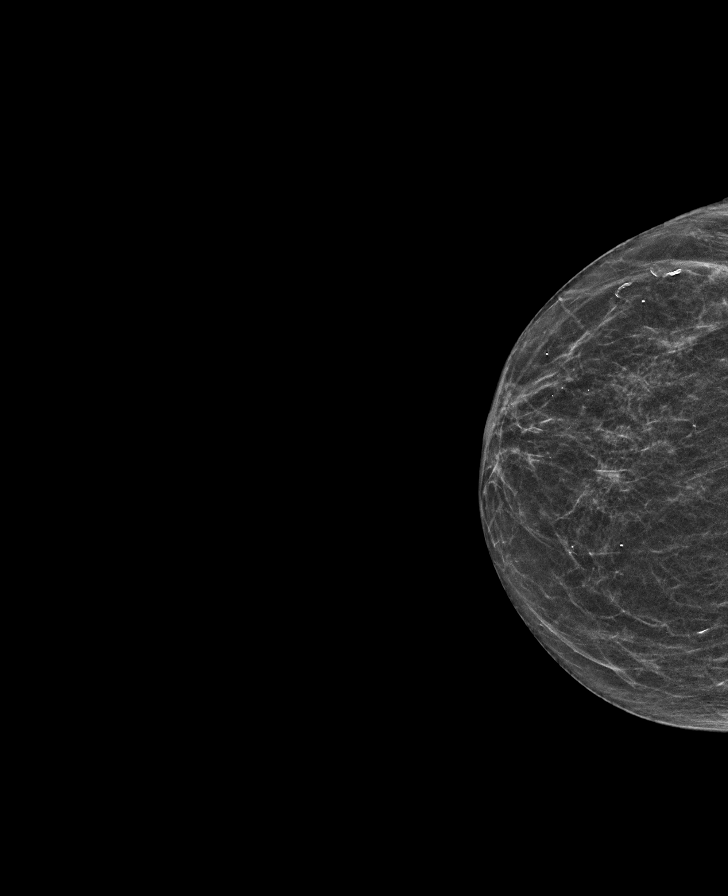

[R MLO tomo · tomo slice 26/51.0]
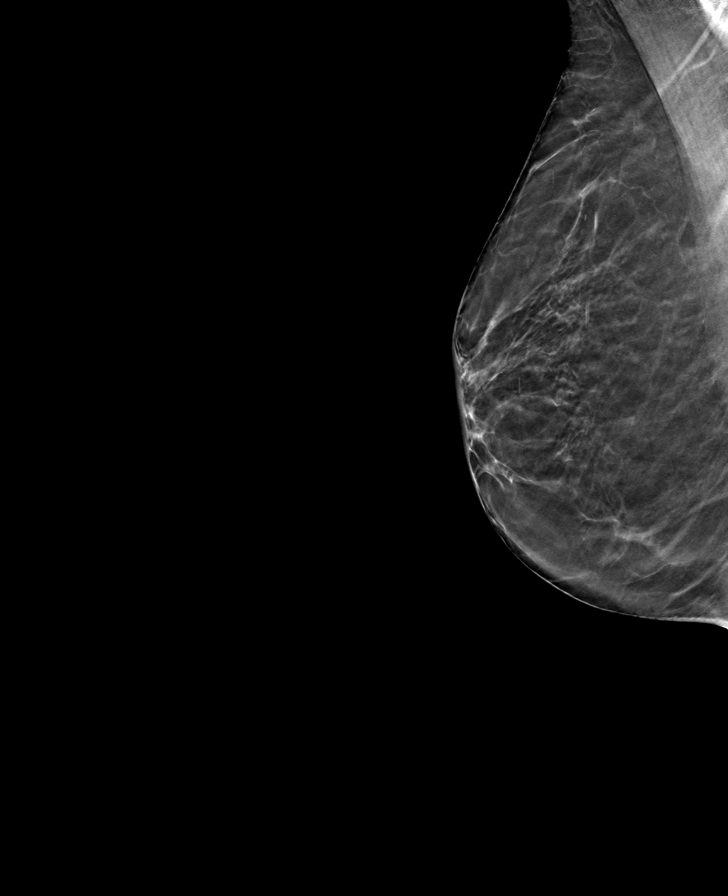

[L CC tomo · tomo slice 23/45.0]
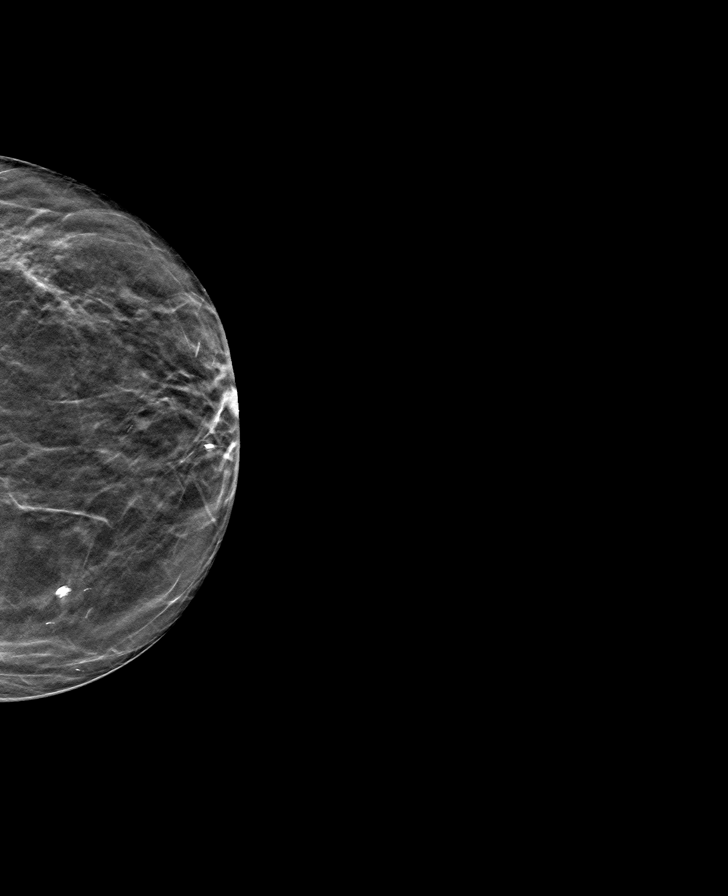

[R CC tomo · tomo slice 24/47.0]
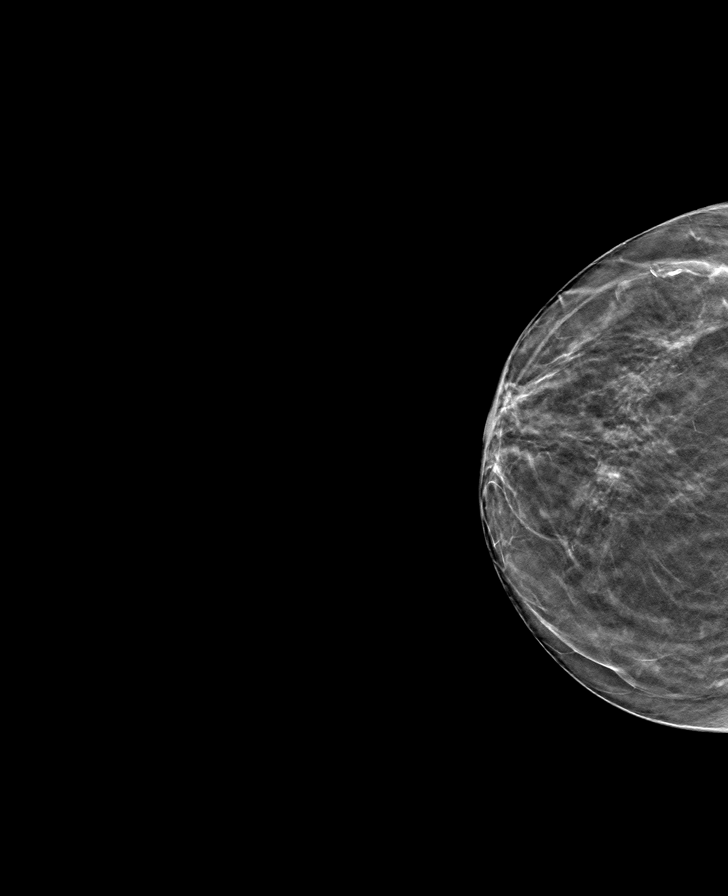

[L MLO tomo · tomo slice 24/47.0]
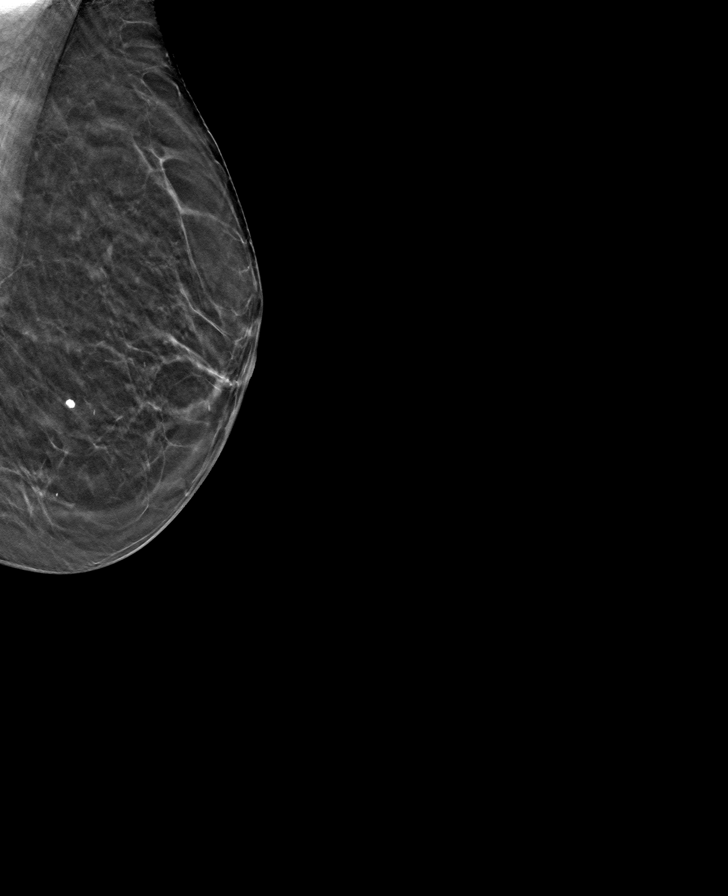

[8 of 24 positions shown; findings below may reference images not displayed]

ACR Breast Density Category b: There are scattered areas of
fibroglandular density.
FINDINGS: There are no findings suspicious for malignancy. Images were
processed with CAD.
IMPRESSION: No mammographic evidence of malignancy. A result letter of this
screening mammogram will be mailed directly to the patient.

RECOMMENDATION:
Screening mammogram in one year. (Code:[id number])

BI-RADS CATEGORY  1: Negative.

## 2021-08-12 DEATH — deceased
